# Patient Record
Sex: Male | Born: 1965 | Race: White | Hispanic: No | Marital: Married | Smoking: Never smoker
Health system: Southern US, Community
[De-identification: ages and names within clinical notes are randomized; demographics above are authoritative.]

## PROBLEM LIST (undated history)

## (undated) DIAGNOSIS — J189 Pneumonia, unspecified organism: Secondary | ICD-10-CM

## (undated) DIAGNOSIS — K219 Gastro-esophageal reflux disease without esophagitis: Secondary | ICD-10-CM

## (undated) DIAGNOSIS — G249 Dystonia, unspecified: Secondary | ICD-10-CM

## (undated) DIAGNOSIS — I10 Essential (primary) hypertension: Secondary | ICD-10-CM

## (undated) DIAGNOSIS — J90 Pleural effusion, not elsewhere classified: Secondary | ICD-10-CM

## (undated) DIAGNOSIS — Z9109 Other allergy status, other than to drugs and biological substances: Secondary | ICD-10-CM

## (undated) DIAGNOSIS — E119 Type 2 diabetes mellitus without complications: Secondary | ICD-10-CM

## (undated) DIAGNOSIS — I429 Cardiomyopathy, unspecified: Secondary | ICD-10-CM

## (undated) DIAGNOSIS — K589 Irritable bowel syndrome without diarrhea: Secondary | ICD-10-CM

## (undated) DIAGNOSIS — I428 Other cardiomyopathies: Secondary | ICD-10-CM

## (undated) HISTORY — DX: Cardiomyopathy, unspecified: I42.9

## (undated) HISTORY — DX: Other cardiomyopathies: I42.8

## (undated) HISTORY — DX: Essential (primary) hypertension: I10

## (undated) HISTORY — DX: Pleural effusion, not elsewhere classified: J90

---

## 1998-10-11 ENCOUNTER — Encounter: Payer: Self-pay | Admitting: Emergency Medicine

## 1998-10-11 ENCOUNTER — Emergency Department (HOSPITAL_COMMUNITY): Admission: EM | Admit: 1998-10-11 | Discharge: 1998-10-11 | Payer: Self-pay | Admitting: Emergency Medicine

## 2001-12-15 ENCOUNTER — Emergency Department (HOSPITAL_COMMUNITY): Admission: EM | Admit: 2001-12-15 | Discharge: 2001-12-16 | Payer: Self-pay | Admitting: Emergency Medicine

## 2001-12-16 ENCOUNTER — Encounter: Payer: Self-pay | Admitting: Emergency Medicine

## 2002-04-08 ENCOUNTER — Emergency Department (HOSPITAL_COMMUNITY): Admission: EM | Admit: 2002-04-08 | Discharge: 2002-04-08 | Payer: Self-pay | Admitting: Emergency Medicine

## 2002-04-08 ENCOUNTER — Encounter: Payer: Self-pay | Admitting: Emergency Medicine

## 2002-08-20 ENCOUNTER — Encounter: Admission: RE | Admit: 2002-08-20 | Discharge: 2002-08-20 | Payer: Self-pay | Admitting: Internal Medicine

## 2002-08-20 ENCOUNTER — Encounter: Payer: Self-pay | Admitting: Internal Medicine

## 2003-02-20 HISTORY — PX: LAPAROSCOPIC CHOLECYSTECTOMY: SUR755

## 2003-10-04 ENCOUNTER — Ambulatory Visit (HOSPITAL_COMMUNITY): Admission: RE | Admit: 2003-10-04 | Discharge: 2003-10-04 | Payer: Self-pay | Admitting: Internal Medicine

## 2003-10-22 ENCOUNTER — Ambulatory Visit (HOSPITAL_COMMUNITY): Admission: RE | Admit: 2003-10-22 | Discharge: 2003-10-22 | Payer: Self-pay | Admitting: Internal Medicine

## 2012-05-10 ENCOUNTER — Emergency Department (HOSPITAL_BASED_OUTPATIENT_CLINIC_OR_DEPARTMENT_OTHER)
Admission: EM | Admit: 2012-05-10 | Discharge: 2012-05-10 | Disposition: A | Discharge status: Home / Self Care | Payer: Self-pay | Attending: Emergency Medicine | Admitting: Emergency Medicine

## 2012-05-10 ENCOUNTER — Emergency Department (HOSPITAL_BASED_OUTPATIENT_CLINIC_OR_DEPARTMENT_OTHER): Payer: Self-pay

## 2012-05-10 ENCOUNTER — Encounter (HOSPITAL_BASED_OUTPATIENT_CLINIC_OR_DEPARTMENT_OTHER): Payer: Self-pay | Admitting: *Deleted

## 2012-05-10 DIAGNOSIS — Z79899 Other long term (current) drug therapy: Secondary | ICD-10-CM | POA: Insufficient documentation

## 2012-05-10 DIAGNOSIS — K219 Gastro-esophageal reflux disease without esophagitis: Secondary | ICD-10-CM | POA: Insufficient documentation

## 2012-05-10 DIAGNOSIS — Z9089 Acquired absence of other organs: Secondary | ICD-10-CM | POA: Insufficient documentation

## 2012-05-10 DIAGNOSIS — K589 Irritable bowel syndrome without diarrhea: Secondary | ICD-10-CM | POA: Insufficient documentation

## 2012-05-10 DIAGNOSIS — R51 Headache: Secondary | ICD-10-CM | POA: Insufficient documentation

## 2012-05-10 DIAGNOSIS — R209 Unspecified disturbances of skin sensation: Secondary | ICD-10-CM | POA: Insufficient documentation

## 2012-05-10 DIAGNOSIS — G500 Trigeminal neuralgia: Secondary | ICD-10-CM | POA: Insufficient documentation

## 2012-05-10 HISTORY — DX: Other allergy status, other than to drugs and biological substances: Z91.09

## 2012-05-10 HISTORY — DX: Irritable bowel syndrome, unspecified: K58.9

## 2012-05-10 LAB — COMPREHENSIVE METABOLIC PANEL
AST: 53 U/L — ABNORMAL HIGH (ref 0–37)
Albumin: 4.1 g/dL (ref 3.5–5.2)
BUN: 15 mg/dL (ref 6–23)
Calcium: 9.7 mg/dL (ref 8.4–10.5)
Creatinine, Ser: 1.1 mg/dL (ref 0.50–1.35)
Total Bilirubin: 0.8 mg/dL (ref 0.3–1.2)
Total Protein: 8.2 g/dL (ref 6.0–8.3)

## 2012-05-10 LAB — CBC WITH DIFFERENTIAL/PLATELET
Basophils Absolute: 0 10*3/uL (ref 0.0–0.1)
Basophils Relative: 0 % (ref 0–1)
Eosinophils Absolute: 0.1 10*3/uL (ref 0.0–0.7)
Eosinophils Relative: 1 % (ref 0–5)
HCT: 44.1 % (ref 39.0–52.0)
Hemoglobin: 15.3 g/dL (ref 13.0–17.0)
MCH: 29 pg (ref 26.0–34.0)
MCHC: 34.7 g/dL (ref 30.0–36.0)
MCV: 83.5 fL (ref 78.0–100.0)
Monocytes Absolute: 0.7 10*3/uL (ref 0.1–1.0)
Monocytes Relative: 11 % (ref 3–12)
Neutro Abs: 4 10*3/uL (ref 1.7–7.7)
RDW: 12.9 % (ref 11.5–15.5)

## 2012-05-10 MED ORDER — GABAPENTIN 300 MG PO CAPS: 300.0000 mg | ORAL_CAPSULE | Freq: Three times a day (TID) | ORAL | Status: DC

## 2012-05-10 MED ORDER — SODIUM CHLORIDE 0.9 % IV BOLUS (SEPSIS)
1000.0000 mL | Freq: Once | INTRAVENOUS | Status: AC
  Administered 2012-05-10: 1000 mL via INTRAVENOUS

## 2012-05-10 MED ORDER — ONDANSETRON HCL 4 MG/2ML IJ SOLN
4.0000 mg | Freq: Once | INTRAMUSCULAR | Status: AC
  Administered 2012-05-10: 4 mg via INTRAVENOUS
  Filled 2012-05-10: qty 2

## 2012-05-10 MED ORDER — KETOROLAC TROMETHAMINE 30 MG/ML IJ SOLN
30.0000 mg | Freq: Once | INTRAMUSCULAR | Status: AC
  Administered 2012-05-10: 30 mg via INTRAVENOUS
  Filled 2012-05-10: qty 1

## 2012-05-10 MED ORDER — MORPHINE SULFATE 4 MG/ML IJ SOLN
4.0000 mg | Freq: Once | INTRAMUSCULAR | Status: AC
  Administered 2012-05-10: 4 mg via INTRAVENOUS
  Filled 2012-05-10: qty 1

## 2012-05-10 MED ORDER — OXYCODONE-ACETAMINOPHEN 5-325 MG PO TABS: 2.0000 | ORAL_TABLET | ORAL | Status: DC | PRN

## 2012-05-10 MED ORDER — DIPHENHYDRAMINE HCL 50 MG/ML IJ SOLN
25.0000 mg | Freq: Once | INTRAMUSCULAR | Status: AC
  Administered 2012-05-10: 25 mg via INTRAVENOUS
  Filled 2012-05-10: qty 1

## 2012-05-10 NOTE — ED Notes (Signed)
Pt back from CT

## 2012-05-10 NOTE — ED Notes (Signed)
Pt c/o cold/flu-like s/s onset Wed. Decreased appetite. Throbbing H/A onset yesterday. PERL.

## 2012-05-10 NOTE — ED Provider Notes (Signed)
History    This chart was scribed for Richard Lyons, MD scribed by Magnus Sinning. The patient was seen in room MH06/MH06 at 20:17.   CSN: [id number]  Arrival date & time 05/10/12  1907  Chief Complaint  Patient presents with  . Headache    (Consider location/radiation/quality/duration/timing/severity/associated sxs/prior treatment) Patient is a 47 y.o. male presenting with headaches. The history is provided by the patient. No language interpreter was used.  Headache  Richard Vance is a 47 y.o. male who presents to the Emergency Department complaining of constant severe HA, onset last night. Pt also notes brief episode of  left hand numbness that occurred today when in bed.   The patient states he came down with cold and flu sxs, including cough, chills, loss of appetite, congestion 4 days ago and he says that he treated it with OTC medications. He says yesterday afternoon he began having severe HA that was relieved minimally by an Excedrin.  He denies any current visual changes or issues, but he states in the past he has experienced cloudiness and "globe like-light" around "christmas lights." He denies any fever, or neck pain, and additionally denies any DM, but states he does have elevated BP. He reports cholecystectomy 9 years ago and he says that at last visit for check-up with GI doctor, he was informed of high BP and they provided him with referral, which he says he has not yet contacted.   Past Medical History  Diagnosis Date  . IBS (irritable bowel syndrome)   . Environmental allergies   . Reflux     Past Surgical History  Procedure Laterality Date  . Cholecystectomy      History reviewed. No pertinent family history.  History  Substance Use Topics  . Smoking status: Never Smoker   . Smokeless tobacco: Not on file  . Alcohol Use: No      Review of Systems  Neurological: Positive for headaches.    Allergies  Review of patient's allergies indicates no known  allergies.  Home Medications   Current Outpatient Rx  Name  Route  Sig  Dispense  Refill  . dicyclomine (BENTYL) 10 MG capsule   Oral   Take 10 mg by mouth 4 (four) times daily -  before meals and at bedtime.         . diphenhydrAMINE (BENADRYL) 25 MG tablet   Oral   Take 25 mg by mouth every 6 (six) hours as needed for itching.         . famotidine (PEPCID) 20 MG tablet   Oral   Take 20 mg by mouth 2 (two) times daily.           BP 167/119  Pulse 116  Temp(Src) 98.3 F (36.8 C) (Oral)  Resp 20  Ht 5\' 7"  (1.702 m)  Wt 231 lb (104.781 kg)  BMI 36.17 kg/m2  SpO2 98%  Physical Exam  Nursing note and vitals reviewed. Constitutional: He is oriented to person, place, and time. He appears well-developed and well-nourished. No distress.  HENT:  Head: Normocephalic and atraumatic.  Eyes: Conjunctivae and EOM are normal. Pupils are equal, round, and reactive to light.  Fundoscopic exam was normal.   Neck: Normal range of motion. Neck supple. No tracheal deviation present.  Cardiovascular: Normal rate, regular rhythm and normal heart sounds.   No murmur heard. Pulmonary/Chest: Effort normal and breath sounds normal. No respiratory distress. He has no wheezes. He has no rales.  Abdominal: Soft. Bowel sounds  are normal. He exhibits no distension. There is no tenderness.  Musculoskeletal: Normal range of motion.  Neurological: He is alert and oriented to person, place, and time. No cranial nerve deficit or sensory deficit.  Skin: Skin is warm and dry. No rash noted.  Psychiatric: He has a normal mood and affect. His behavior is normal.    ED Course  Procedures (including critical care time) DIAGNOSTIC STUDIES: Oxygen Saturation is 98% on room air, normal by my interpretation.    COORDINATION OF CARE: 20:21: Physical exam performed. 20:23: Provided intent to order lab and administer medication for HA and nausea. Pt is agreeable.   Labs Reviewed  COMPREHENSIVE  METABOLIC PANEL - Abnormal; Notable for the following:    Glucose, Bld 146 (*)    AST 53 (*)    ALT 60 (*)    GFR calc non Af Amer 80 (*)    All other components within normal limits  CBC WITH DIFFERENTIAL   Ct Head Wo Contrast  05/10/2012  *RADIOLOGY REPORT*  Clinical Data:  Headache.  CT HEAD WITHOUT CONTRAST  Technique:  Contiguous axial images were obtained from the base of the skull through the vertex without contrast  Comparison:  None.  Findings:  The brain has a normal appearance without evidence for hemorrhage, acute infarction, hydrocephalus, or mass lesion.  There is no extra axial fluid collection.  The skull and paranasal sinuses are normal.  IMPRESSION: Normal CT of the head without contrast.   Original Report Authenticated By: Janeece Riggers, M.D.      No diagnosis found.    MDM  The patient presents here with complaints of severe, stabbing pains to the right side of his face and temple.  He later described these to me a "electrical shocks" that sounded like trigeminal neuralgia to me.  He was given medications and eventually his symptoms resolved.  I found nothing on the workup that appears emergent.  CT and labs were unremarkable and neurological exam was non-focal.  I will discharge him with pain meds and a course of neurontin.  He is to follow up with his pcp this week and return should his symptoms worsen or change.      I personally performed the services described in this documentation, which was scribed in my presence. The recorded information has been reviewed and is accurate.           Richard Lyons, MD 05/11/12 [phone]

## 2012-05-10 NOTE — ED Notes (Signed)
MD at bedside. 

## 2014-05-21 HISTORY — PX: VIDEO ASSISTED THORACOSCOPY (VATS)/DECORTICATION: SHX6171

## 2014-05-21 HISTORY — PX: LUNG DECORTICATION: SHX454

## 2014-08-31 NOTE — Progress Notes (Signed)
     HPI: 49 yo male for evaluation of cardiomyopathy. Patient was admitted in April 2016 to [facility] in [place] with exudative left pleural effusion secondary to pneumonia. He was diagnosed with a nonischemic cardiomyopathy with an ejection fraction of 15-20%. Full records are not available. He did have left video-assisted thoracic total decortication. Patient was treated with medications for his cardiomyopathy. Since returning home he has dyspnea with more moderate activities but not routine activities. No orthopnea or PND. No exertional chest pain or syncope. No pedal edema.  Current Outpatient Prescriptions  Medication Sig Dispense Refill  . chlorpheniramine (WAL-FINATE) 4 MG tablet Take 4 mg by mouth 2 (two) times daily as needed for allergies.    Marland Kitchen dicyclomine (BENTYL) 10 MG capsule Take 10 mg by mouth 2 (two) times daily.     . famotidine (PEPCID) 20 MG tablet Take 20 mg by mouth 2 (two) times daily.    . furosemide (LASIX) 40 MG tablet Take 40 mg by mouth.    Marland Kitchen glipiZIDE (GLUCOTROL) 10 MG tablet Take 10 mg by mouth daily before breakfast.    . metoprolol succinate (TOPROL-XL) 50 MG 24 hr tablet Take 50 mg by mouth daily. Take with or immediately following a meal.    . spironolactone (ALDACTONE) 25 MG tablet Take 25 mg by mouth daily.    . valsartan (DIOVAN) 160 MG tablet Take 1 tablet (160 mg total) by mouth daily. 30 tablet 9   No current facility-administered medications for this visit.    No Known Allergies   Past Medical History  Diagnosis Date  . IBS (irritable bowel syndrome)   . Environmental allergies   . Reflux   . Cardiomyopathy   . Recurrent left pleural effusion   . Hypertension   . Diabetes mellitus     Past Surgical History  Procedure Laterality Date  . Cholecystectomy    . Left pleural decortication      History   Social History  . Marital Status: Married    Spouse Name: N/A  . Number of Children: 3  . Years of Education: N/A    Occupational History  . Not on file.   Social History Main Topics  . Smoking status: Never Smoker   . Smokeless tobacco: Not on file  . Alcohol Use: No  . Drug Use: No  . Sexual Activity: Not on file   Other Topics Concern  . Not on file   Social History Narrative    Family History  Problem Relation Age of Onset  . Diabetes Father   . Heart disease Father     ROS: no fevers or chills, productive cough, hemoptysis, dysphasia, odynophagia, melena, hematochezia, dysuria, hematuria, rash, seizure activity, orthopnea, PND, pedal edema, claudication. Remaining systems are negative.  Physical Exam:   Blood pressure 163/110, pulse 125, height 5\' 7"  (1.702 m), weight 217 lb (98.431 kg).  General:  Well developed/well nourished in NAD Skin warm/dry Patient not depressed No peripheral clubbing Back-normal HEENT-normal/normal eyelids Neck supple/normal carotid upstroke bilaterally; no bruits; no JVD; no thyromegaly chest - CTA/ normal expansion CV - regular rhythm and tachycardic rate/normal S1 and S2; no murmurs, rubs;  PMI nondisplaced; positive gallop Abdomen -NT/ND, no HSM, no mass, + bowel sounds, no bruit 2+ femoral pulses, no bruits Ext-trace edema, no chords, 2+ DP Neuro-grossly nonfocal  ECG sinus tachycardia, left axis deviation, no significant ST changes.

## 2014-09-01 ENCOUNTER — Encounter: Payer: Self-pay | Admitting: Cardiology

## 2014-09-01 ENCOUNTER — Other Ambulatory Visit: Payer: Self-pay

## 2014-09-01 ENCOUNTER — Ambulatory Visit (INDEPENDENT_AMBULATORY_CARE_PROVIDER_SITE_OTHER): Payer: Self-pay | Admitting: Cardiology

## 2014-09-01 VITALS — BP 163/110 | HR 125 | Ht 67.0 in | Wt 217.0 lb

## 2014-09-01 DIAGNOSIS — I5041 Acute combined systolic (congestive) and diastolic (congestive) heart failure: Secondary | ICD-10-CM

## 2014-09-01 DIAGNOSIS — I10 Essential (primary) hypertension: Secondary | ICD-10-CM | POA: Insufficient documentation

## 2014-09-01 DIAGNOSIS — I420 Dilated cardiomyopathy: Secondary | ICD-10-CM | POA: Insufficient documentation

## 2014-09-01 DIAGNOSIS — I5022 Chronic systolic (congestive) heart failure: Secondary | ICD-10-CM | POA: Insufficient documentation

## 2014-09-01 MED ORDER — VALSARTAN 160 MG PO TABS: 160.0000 mg | ORAL_TABLET | Freq: Every day | ORAL | Status: DC

## 2014-09-01 NOTE — Patient Instructions (Signed)
Medication Instructions:  - increase diovan ( 160 mg ) daily sent into pt's requested pharmacy  Labwork: bmet  Testing/Procedures: none  Follow-Up: As scheduled  Any Other Special Instructions Will Be Listed Below (If Applicable).

## 2014-09-01 NOTE — Assessment & Plan Note (Signed)
Blood pressure is elevated. Increase Diovan to 160 mg daily. Continue to titrate medications for blood pressure control. I've asked him to follow his blood pressure at home and keep records. He will bring his cuff at next office visit and we will make sure it correlates with ours.

## 2014-09-01 NOTE — Assessment & Plan Note (Signed)
Patient appears to be euvolemic. Continue present dose of Dilantin ex. Check potassium and renal function.

## 2014-09-01 NOTE — Assessment & Plan Note (Signed)
Patient has a cardiomyopathy of uncertain etiology. His ejection fraction was 15% but I do not have all records available. We will obtain records from [facility] concerning for evaluation. His blood pressure is elevated and I wonder if he may have a hypertensive cardiomyopathy. No history of alcohol. We will review records and make sure that ischemia has been ruled out. It also occurred in the setting of pneumonia. Question septic cardiomyopathy. He does have a gallop on examination. Plan to continue Toprol and ARB. Increase Diovan to 160 mg daily. He will follow his blood pressure at home and see one of our assistance in approximately 4 weeks. Continue to titrate medications. Once fully titrated and blood pressure controlled we will plan to repeat echocardiogram. If ejection fraction less than 35% would need to consider ICD.

## 2014-09-02 ENCOUNTER — Telehealth: Payer: Self-pay | Admitting: *Deleted

## 2014-09-02 LAB — BASIC METABOLIC PANEL
BUN: 10 mg/dL (ref 6–23)
CALCIUM: 9 mg/dL (ref 8.4–10.5)
CO2: 25 mEq/L (ref 19–32)
CREATININE: 0.87 mg/dL (ref 0.50–1.35)
Chloride: 106 mEq/L (ref 96–112)
GLUCOSE: 89 mg/dL (ref 70–99)
Potassium: 3.5 mEq/L (ref 3.5–5.3)
SODIUM: 143 meq/L (ref 135–145)

## 2014-09-02 NOTE — Telephone Encounter (Signed)
LEFT MESSAGE  TO CALL BACK -- IN REGARDS TO LABS 

## 2014-09-02 NOTE — Telephone Encounter (Signed)
-----   Message from Lewayne Bunting, MD sent at 09/02/2014  6:05 AM EDT ----- Richard Vance

## 2014-09-03 ENCOUNTER — Telehealth: Payer: Self-pay | Admitting: *Deleted

## 2014-09-03 NOTE — Telephone Encounter (Signed)
Sent medical records request for [facility] in [place] back to pt.  I did not get pt to sign release.  Pt will either mail back to [place] or drop off at the [employer] in [facility]

## 2014-09-15 ENCOUNTER — Encounter: Payer: Self-pay | Admitting: *Deleted

## 2014-09-17 ENCOUNTER — Telehealth: Payer: Self-pay | Admitting: Cardiology

## 2014-09-17 NOTE — Telephone Encounter (Signed)
Patient reports he received a bill from [facility] for the lab work. States he was originally in [place] for lab work and then someone from [facility] took him to get the lab work. Explained that [facility] still processed the blood work even if a [facility] employee did the lab. He reports he was told [facility] would process the labs so he wouldn't have a [facility] lab fee. He reports he will call the med center on [id number] for more info regarding this  Patient notified labs were normal and that he will get a copy of the results in the mail

## 2014-09-17 NOTE — Telephone Encounter (Signed)
Pt would like his lab results from 09-01-14 please.

## 2014-09-21 ENCOUNTER — Telehealth: Payer: Self-pay | Admitting: Physician Assistant

## 2014-09-21 NOTE — Telephone Encounter (Signed)
ROI faxed to [facility] records received back placed in chart prep bin.

## 2014-09-22 ENCOUNTER — Telehealth: Payer: Self-pay | Admitting: Cardiology

## 2014-09-22 DIAGNOSIS — I5041 Acute combined systolic (congestive) and diastolic (congestive) heart failure: Secondary | ICD-10-CM

## 2014-09-22 MED ORDER — VALSARTAN 320 MG PO TABS: 320.0000 mg | ORAL_TABLET | Freq: Every day | ORAL | Status: DC

## 2014-09-22 NOTE — Telephone Encounter (Signed)
Patient call and left message that he has been on Valsartin  160 mg daily since July 13th and wants to know when is blood pressure will start getting lower  July 14th    165/134 July 16th    151/115 July 22nd    148/119 July 25th     168/131 August 3rd________  [facility]

## 2014-09-22 NOTE — Telephone Encounter (Signed)
Patient returned call.  He denies any headache, weakness, shortness of breath.  C/o feeling tired out  Blood pressure 8/2 150/129    8/3 160/127  Dr. Royann Shivers ordered patient to double up on Diovan;  320 mg daily  Patient understands to take two 160mg  daily of his Diovan  Patient will take blood pressure daily and call in 5 days with blood pressure readings  He will call sooner if he has any symptoms such as shortness of breath, Chest pain, Headache, loss of vision of weakness in right or left side

## 2014-09-22 NOTE — Telephone Encounter (Signed)
New message       Pt c/o medication issue:  1. Name of Medication: valsartin 2. How are you currently taking this medication (dosage and times per day)? 160mg  daily since July 13th 3. Are you having a reaction (difficulty breathing--STAT)? no  4. What is your medication issue?  Pt want to know how long it will take before he sees his bp getting lower?  bp readings are  July 14 165/134, July 16 151/115, July 22 148/119, July 25 168/131

## 2014-09-27 ENCOUNTER — Emergency Department (HOSPITAL_BASED_OUTPATIENT_CLINIC_OR_DEPARTMENT_OTHER)
Admission: EM | Admit: 2014-09-27 | Discharge: 2014-09-27 | Disposition: A | Discharge status: Home / Self Care | Payer: Self-pay | Attending: Emergency Medicine | Admitting: Emergency Medicine

## 2014-09-27 ENCOUNTER — Emergency Department (HOSPITAL_BASED_OUTPATIENT_CLINIC_OR_DEPARTMENT_OTHER): Payer: Self-pay

## 2014-09-27 ENCOUNTER — Encounter (HOSPITAL_BASED_OUTPATIENT_CLINIC_OR_DEPARTMENT_OTHER): Payer: Self-pay

## 2014-09-27 DIAGNOSIS — R5383 Other fatigue: Secondary | ICD-10-CM | POA: Insufficient documentation

## 2014-09-27 DIAGNOSIS — Z79899 Other long term (current) drug therapy: Secondary | ICD-10-CM | POA: Insufficient documentation

## 2014-09-27 DIAGNOSIS — I10 Essential (primary) hypertension: Secondary | ICD-10-CM | POA: Insufficient documentation

## 2014-09-27 DIAGNOSIS — E119 Type 2 diabetes mellitus without complications: Secondary | ICD-10-CM | POA: Insufficient documentation

## 2014-09-27 DIAGNOSIS — R110 Nausea: Secondary | ICD-10-CM | POA: Insufficient documentation

## 2014-09-27 DIAGNOSIS — R197 Diarrhea, unspecified: Secondary | ICD-10-CM | POA: Insufficient documentation

## 2014-09-27 DIAGNOSIS — R630 Anorexia: Secondary | ICD-10-CM | POA: Insufficient documentation

## 2014-09-27 DIAGNOSIS — K219 Gastro-esophageal reflux disease without esophagitis: Secondary | ICD-10-CM | POA: Insufficient documentation

## 2014-09-27 DIAGNOSIS — R188 Other ascites: Secondary | ICD-10-CM | POA: Insufficient documentation

## 2014-09-27 DIAGNOSIS — R17 Unspecified jaundice: Secondary | ICD-10-CM | POA: Insufficient documentation

## 2014-09-27 DIAGNOSIS — Z8709 Personal history of other diseases of the respiratory system: Secondary | ICD-10-CM | POA: Insufficient documentation

## 2014-09-27 LAB — COMPREHENSIVE METABOLIC PANEL
ALBUMIN: 4 g/dL (ref 3.5–5.0)
ALT: 16 U/L — AB (ref 17–63)
ANION GAP: 10 (ref 5–15)
AST: 22 U/L (ref 15–41)
Alkaline Phosphatase: 60 U/L (ref 38–126)
BILIRUBIN TOTAL: 3.1 mg/dL — AB (ref 0.3–1.2)
BUN: 16 mg/dL (ref 6–20)
CHLORIDE: 106 mmol/L (ref 101–111)
CO2: 24 mmol/L (ref 22–32)
Calcium: 9.4 mg/dL (ref 8.9–10.3)
Creatinine, Ser: 0.99 mg/dL (ref 0.61–1.24)
GFR calc Af Amer: >60 mL/min (ref >60)
Glucose, Bld: 129 mg/dL — ABNORMAL HIGH (ref 65–99)
Potassium: 3.9 mmol/L (ref 3.5–5.1)
Sodium: 140 mmol/L (ref 135–145)
Total Protein: 6.9 g/dL (ref 6.5–8.1)

## 2014-09-27 LAB — URINALYSIS, ROUTINE W REFLEX MICROSCOPIC
GLUCOSE, UA: NEGATIVE mg/dL
Hgb urine dipstick: NEGATIVE
KETONES UR: NEGATIVE mg/dL
Leukocytes, UA: NEGATIVE
NITRITE: NEGATIVE
Protein, ur: 100 mg/dL — AB
SPECIFIC GRAVITY, URINE: 1.024 (ref 1.005–1.030)
Urobilinogen, UA: 1 mg/dL (ref 0.0–1.0)
pH: 5 (ref 5.0–8.0)

## 2014-09-27 LAB — CBC WITH DIFFERENTIAL/PLATELET
BASOS PCT: 0 % (ref 0–1)
Basophils Absolute: 0 10*3/uL (ref 0.0–0.1)
Eosinophils Absolute: 0 10*3/uL (ref 0.0–0.7)
Eosinophils Relative: 1 % (ref 0–5)
HEMATOCRIT: 43 % (ref 39.0–52.0)
HEMOGLOBIN: 14.2 g/dL (ref 13.0–17.0)
LYMPHS PCT: 33 % (ref 12–46)
Lymphs Abs: 2.4 10*3/uL (ref 0.7–4.0)
MCH: 28.4 pg (ref 26.0–34.0)
MCHC: 33 g/dL (ref 30.0–36.0)
MCV: 86 fL (ref 78.0–100.0)
Monocytes Absolute: 0.5 10*3/uL (ref 0.1–1.0)
Monocytes Relative: 7 % (ref 3–12)
Neutro Abs: 4.3 10*3/uL (ref 1.7–7.7)
Neutrophils Relative %: 59 % (ref 43–77)
PLATELETS: 169 10*3/uL (ref 150–400)
RBC: 5 MIL/uL (ref 4.22–5.81)
RDW: 14.8 % (ref 11.5–15.5)
WBC: 7.2 10*3/uL (ref 4.0–10.5)

## 2014-09-27 LAB — LIPASE, BLOOD: LIPASE: 24 U/L (ref 22–51)

## 2014-09-27 LAB — URINE MICROSCOPIC-ADD ON

## 2014-09-27 LAB — TROPONIN I

## 2014-09-27 MED ORDER — METOCLOPRAMIDE HCL 10 MG PO TABS: 10.0000 mg | ORAL_TABLET | Freq: Four times a day (QID) | ORAL | Status: DC

## 2014-09-27 MED ORDER — IOHEXOL 300 MG/ML  SOLN
25.0000 mL | Freq: Once | INTRAMUSCULAR | Status: AC | PRN
  Administered 2014-09-27: 25 mL via ORAL

## 2014-09-27 MED ORDER — IOHEXOL 300 MG/ML  SOLN
100.0000 mL | Freq: Once | INTRAMUSCULAR | Status: AC | PRN
  Administered 2014-09-27: 100 mL via INTRAVENOUS

## 2014-09-27 NOTE — ED Provider Notes (Signed)
Care was discussed with me prior to patient's discharge. Patient was seen by Dr. Littie Deeds earlier along with the PA. Patient is here for abdominal bloating for the last 3 weeks as well as nausea. Patient's bilirubin was 3 but has normal AST and ALT though CT abdomen pelvis was ordered. CT showed new-onset ascites as well as small pericardial effusion. Patient was admitted several months ago and was diagnosed with nonischemic cardiomyopathy with EF 15%. Patient also had pleural effusion in time was secondary to pneumonia. Patient denies any chest pain or shortness of breath. He has been taking Lasix and is no additional leg swelling. Patient denies any abdominal pain and is not a drinker. He already had a cholecystectomy. Denies any fevers or chills. At this point, he has no signs of tamponade from the pericardial effusion. He does not appear to be in heart failure and appears euvolemic. He has no signs of liver failure. I do not know why he has new onset ascites. Has no signs of SBP. I discussed the case with Dr. Arlyce Dice from gastroenterology. He feels that patient is stable for discharge and his office will contact the patient this week. I told him to increase lasix to 40 mg BID x 4 days then back to usual dose. Will give reglan for symptomatic relief.   Richardean Canal, MD 09/27/14 [phone]

## 2014-09-27 NOTE — ED Notes (Addendum)
C/o nausea, abd bloating x 3 weeks-no relief with OTC meds

## 2014-09-27 NOTE — Discharge Instructions (Signed)
1. Medications: reglan, usual home medications 2. Treatment: rest, drink plenty of fluids 3. Follow Up: please followup with Dr. Arlyce Dice (gastroenterologist); r Ascites Ascites is a gathering of fluid in the belly (abdomen). This is most often caused by liver disease. It may also be caused by a number of other less common problems. It causes a ballooning out (distension) of the abdomen. CAUSES  Scarring of the liver (cirrhosis) is the most common cause of ascites. Other causes include:  Infection or inflammation in the abdomen.  Cancer in the abdomen.  Heart failure.  Certain forms of kidney failure (nephritic syndrome).  Inflammation of the pancreas.  Clots in the veins of the liver. SYMPTOMS  In the early stages of ascites, you may not have any symptoms. The main symptom of ascites is a sense of abdominal bloating. This is due to the presence of fluid. This may also cause an increase in abdominal or waist size. People with this condition can develop swelling in the legs, and men can develop a swollen scrotum. When there is a lot of fluid, it may be hard to breath. Stretching of the abdomen by fluid can be painful. DIAGNOSIS  Certain features of your medical history, such as a history of liver disease and of an enlarging abdomen, can suggest the presence of ascites. The diagnosis of ascites can be made on physical exam by your caregiver. An abdominal ultrasound examination can confirm that ascites is present, and estimate the amount of fluid. Once ascites is confirmed, it is important to determine its cause. Again, a history of one of the conditions listed in "CAUSES" provides a strong clue. A physical exam is important, and blood and X-ray tests may be needed. During a procedure called paracentesis, a sample of fluid is removed from the abdomen. This can determine certain key features about the fluid, such as whether or not infection or cancer is present. Your caregiver will determine if a  paracentesis is necessary. They will describe the procedure to you. PREVENTION  Ascites is a complication of other conditions. Therefore to prevent ascites, you must seek treatment for any significant health conditions you have. Once ascites is present, careful attention to fluid and salt intake may help prevent it from getting worse. If you have ascites, you should not drink alcohol. PROGNOSIS  The prognosis of ascites depends on the underlying disease. If the disease is reversible, such as with certain infections or with heart failure, then ascites may improve or disappear. When ascites is caused by cirrhosis, then it indicates that the liver disease has worsened, and further evaluation and treatment of the liver disease is needed. If your ascites is caused by cancer, then the success or failure of the cancer treatment will determine whether your ascites will improve or worsen. RISKS AND COMPLICATIONS  Ascites is likely to worsen if it is not properly diagnosed and treated. A large amount of ascites can cause pain and difficulty breathing. The main complication, besides worsening, is infection (called spontaneous bacterial peritonitis). This requires prompt treatment. TREATMENT  The treatment of ascites depends on its cause. When liver disease is your cause, medical management using water pills (diuretics) and decreasing salt intake is often effective. Ascites due to peritoneal inflammation or malignancy (cancer) alone does not respond to salt restriction and diuretics. Hospitalization is sometimes required. If the treatment of ascites cannot be managed with medications, a number of other treatments are available. Your caregivers will help you decide which will work best for you. Some  of these are:  Removal of fluid from the abdomen (paracentesis).  Fluid from the abdomen is passed into a vein (peritoneovenous shunting).  Liver transplantation.  Transjugular intrahepatic portosystemic stent  shunt. HOME CARE INSTRUCTIONS  It is important to monitor body weight and the intake and output of fluids. Weigh yourself at the same time every day. Record your weights. Fluid restriction may be necessary. It is also important to know your salt intake. The more salt you take in, the more fluid you will retain. Ninety percent of people with ascites respond to this approach.  Follow any directions for medicines carefully.  Follow up with your caregiver, as directed.  Report any changes in your health, especially any new or worsening symptoms.  If your ascites is from liver disease, avoid alcohol and other substances toxic to the liver. SEEK MEDICAL CARE IF:   Your weight increases more than a few pounds in a few days.  Your abdominal or waist size increases.  You develop swelling in your legs.  You had swelling and it worsens. SEEK IMMEDIATE MEDICAL CARE IF:   You develop a fever.  You develop new abdominal pain.  You develop difficulty breathing.  You develop confusion.  You have bleeding from the mouth, stomach, or rectum. MAKE SURE YOU:   Understand these instructions.  Will watch your condition.  Will get help right away if you are not doing well or get worse. Document Released: 02/05/2005 Document Revised: 04/30/2011 Document Reviewed: 09/06/2006 [facility] Patient Information 2015 [place], [place]. This information is not intended to replace advice given to you by your health care provider. Make sure you discuss any questions you have with your health care provider. eturn to the ED for severe persistent nausea, abdominal pain or swelling, fever, chills, chest pain, shortness of breath  Nausea, Adult Nausea means you feel sick to your stomach or need to throw up (vomit). It may be a sign of a more serious problem. If nausea gets worse, you may throw up. If you throw up a lot, you may lose too much body fluid (dehydration). HOME CARE   Get plenty of rest.  Ask your  doctor how to replace body fluid losses (rehydrate).  Eat small amounts of food. Sip liquids more often.  Take all medicines as told by your doctor. GET HELP RIGHT AWAY IF:  You have a fever.  You pass out (faint).  You keep throwing up or have blood in your throw up.  You are very weak, have dry lips or a dry mouth, or you are very thirsty (dehydrated).  You have dark or bloody poop (stool).  You have very bad chest or belly (abdominal) pain.  You do not get better after 2 days, or you get worse.  You have a headache. MAKE SURE YOU:  Understand these instructions.  Will watch your condition.  Will get help right away if you are not doing well or get worse. Document Released: 01/25/2011 Document Revised: 04/30/2011 Document Reviewed: 01/25/2011 [facility] Patient Information 2015 [place], [place]. This information is not intended to replace advice given to you by your health care provider. Make sure you discuss any questions you have with your health care provider.   Emergency Department Resource Guide 1) Find a Doctor and Pay Out of Pocket Although you won't have to find out who is covered by your insurance plan, it is a good idea to ask around and get recommendations. You will then need to call the office and see if  the doctor you have chosen will accept you as a new patient and what types of options they offer for patients who are self-pay. Some doctors offer discounts or will set up payment plans for their patients who do not have insurance, but you will need to ask so you aren't surprised when you get to your appointment.  2) Contact Your Local Health Department Not all health departments have doctors that can see patients for sick visits, but many do, so it is worth a call to see if yours does. If you don't know where your local health department is, you can check in your phone book. The CDC also has a tool to help you locate your state's health department, and many state  websites also have listings of all of their local health departments.  3) Find a Walk-in Clinic If your illness is not likely to be very severe or complicated, you may want to try a walk in clinic. These are popping up all over the country in pharmacies, drugstores, and shopping centers. They're usually staffed by nurse practitioners or physician assistants that have been trained to treat common illnesses and complaints. They're usually fairly quick and inexpensive. However, if you have serious medical issues or chronic medical problems, these are probably not your best option.  No Primary Care Doctor: - Call Health Connect at  [phone] - they can help you locate a primary care doctor that  accepts your insurance, provides certain services, etc. - Physician Referral Service- [phone]  Chronic Pain Problems: Organization         Address  Phone   Notes  [facility]  [phone] Patients need to be referred by their primary care doctor.   Medication Assistance: Organization         Address  Phone   Notes  [facility] Medication [facility] [address] [place] [place], [place] [address] [phone] --Must be a resident of [facility] -- Must have NO insurance coverage whatsoever (no Medicaid/ Medicare, etc.) -- The pt. MUST have a primary care doctor that directs their care regularly and follows them in the community   Med[facility]ist  [phone]   [employer]  [phone]    Agencies that provide inexpensive medical care: Organization         Address  Phone   Notes  [facility]  [phone]   [facility]    [phone]   [facility] [address] [place], [place] [address] [phone]   [facility] [address] [place] [place] [phone]   Planned Parenthood    [phone]   [facility]    [phone]   [facility]  [address], [place] Phone:  [phone], Fax:  [phone] Hours of Operation:  9 am - 6 pm, M-F.  Also accepts Medicaid/Medicare and self-pay.  [facility]  [address], [place] Phone: [phone], Fax: [phone]. Hours of Operation:  8:30 am - 5:30 pm, M-F.  Also accepts Medicaid and self-pay.  [facility] [place] [address], [place] Phone: [phone]   [facility] [address] Natasha Bence [place], [place] [phone], Ext. [phone] Mondays & Thursdays: 7-9 AM.  First 15 patients are seen on a first come, first serve basis.    Medicaid-accepting [facility] Providers:  Organization         Address  Phone   Notes  [facility] [address], [place] [phone] Also accepts self-pay patients.  [facility] [address] Laurell Josephs [place], [place]  [phone]   [facility] [address], [place] [phone]   [facility] [address], [place] [phone]   Renaye Rakers [address], [place]   [phone] Only accepts [place] Access [place] patients after they have their name applied to their card.   Self-Pay (no insurance) in [facility]:  Organization         Address  Phone   Notes  Sickle Cell Patients, [facility] [address] [place], [place] [phone]   [facility] [address] [place], [place] [phone]   [facility] [place]  [address], [place] [phone]   [facility]/Dr. Osei-Bonsu  [address] [place] or [address], [place] [phone] Phone number for both [place] and [place] locations is the same.  [facility] [address] [place] [phone]   [facility] [address] [place] or [address] [phone]   [facility] [address], [place] [phone], phone; [phone], fax Sees patients 1st and 3rd Saturday of every month.  Must not qualify for public or private insurance (i.e. Medicaid, Medicare, [place] Health Choice, Veterans' Benefits)  Household income should be no more than 200% of the poverty level The clinic cannot treat you if you are pregnant or think you are pregnant  Sexually transmitted diseases are not treated at the clinic.    Dental Care: Organization         Address  Phone  Notes  [facility] Department of [facility] [address] [place], [place] [phone] Accepts children up to age 60 who are enrolled in [place] or [place] Health Choice; pregnant women with a Medicaid card; and children who have applied for Medicaid or [place] Health Choice, but were declined, whose parents can pay a reduced fee at time of service.  [facility] Department of [facility]  [address], [place] [phone] Accepts children up to age 45 who are enrolled in [place] or [place] Health Choice; pregnant women with a Medicaid card; and children who have applied for Medicaid or [place] Health Choice, but were declined, whose parents can pay a reduced fee at time of service.  [facility]  [address] [place], [place] [phone] Patients are seen by appointment only. Walk-ins are not accepted. [facility] will see patients 5 years of age and older. Monday - Tuesday (8am-5pm) Most Wednesdays (8:30-5pm) $30 per visit, cash only  [facility]  [address], [facility] [phone] Patients are seen by appointment only. Walk-ins are not accepted. [facility] will see patients 38 years of age and older. One Wednesday Evening (Monthly: Volunteer Based).  $30 per visit, cash only  [employer]  [phone] for adults; Children under age 25, call Graduate Pediatric Dentistry at [phone]. Children aged 43-14, please call [phone] to request a pediatric application.  Dental services  are provided in all areas of dental care including fillings, crowns and bridges, complete and partial dentures, implants, gum treatment, root canals, and extractions. Preventive care is also provided. Treatment is provided to both adults and children. Patients are selected via a lottery and there is often a waiting list.   [facility] [address] [place]  [phone] [email or web address]   [facility] [address] [place], [place] [phone], Ext. [phone] Second and Fourth Thursday of each month, opens at 6:30 AM; Clinic ends at 9 AM.  Patients are seen on a first-come first-served basis, and a limited number are seen during each clinic.   [facility]  [address] Ether Griffins [place], [place] [phone]   Eligibility Requirements You must have lived in [place], [place], or [place] counties for at least the last three months.   You cannot be eligible for state or federal sponsored [place], including [employer], [place], or [employer].   You generally cannot be eligible for healthcare insurance through your employer.    How to apply: Eligibility screenings are held every Tuesday and Wednesday afternoon from 1:00 pm until 4:00 pm. You do not need an appointment for the interview!  [facility] [address] [place], [place] [phone]   [facility] Health Department  [phone]   [facility] Health Department  [phone]   [facility]  [phone]    Behavioral Health Resources in the Community: Intensive Outpatient Programs Organization         Address  Phone  Notes  [facility] [address], [place], [place]  [phone]   [facility]patient [address], [place], [place] [phone]   [facility] [address], [place], [place]  [phone]   [facility] Mental Health [address],  [place], [place] [phone] or [phone]   Substance Abuse Resources Organization         Address  Phone  Notes  [facility]  [phone]   [facility]  [phone]   [facility] [place]  [phone]   Floydene Flock  [phone]   Residential & Outpatient Substance Abuse Program  [phone]   Psychological Services Organization         Address  Phone  Notes  [facility] Behavioral Health  [phone]   [facility] Services  [phone]   [facility] [address], [place] [phone] or [phone]    Mobile Crisis Teams Organization         Address  Phone  Notes  [facility], Mobile Crisis Care Unit  [phone]   [facility]  [address] [place], [place] [phone]   Doristine Locks [address] [place] [phone]    Self-Help/Support Groups Organization         Address  Phone             Notes  [facility] [place] - variety of support groups  336- I7437963 Call for more information  Narcotics Anonymous (NA), [facility] [address], [employer] [place]  2 meetings at this location   Statistician         Address  Phone  Notes  [facility] [address],    [place]  [phone]   [facility]  [address], [place] [address], [place], [place] [phone]  [facility] [address] [place], [place] [phone] Admissions: 8am-3pm M-F  [facility] [address]    [place], [place] [phone]   [facility] [address] [place], [place], [place] [phone]   [facility] [address]   [place], [place] [phone]   [facility] - Intensive Outpatient [address] Laurell Josephs [address], [place], [place] [phone]   [facility] ([facility]iction [facility] [address] [place]  [place], [place] [phone] or [phone]   [facility] ([facility]) [address], [place], [place] [phone] Accepts Medicaid  [facility] [address],  [place] [phone] Substance Abuse/Addiction Treatment   [facility] Organization         Address  Phone  Notes  [facility]  [phone]   Angie Fava, PhD [address] Ervin Knack [place], [place]   [phone] or [phone]   [facility] Behavioral   [address] [place], [place] [phone]   [facility] [address], [place], [place] [phone] Insurance/Medicaid/sponsorship through [facility] and Families [address]                                    [place], [place] [phone] Therapy/tele-psych/case  [facility] [address], [place] [phone]    Dr. Lolly Mustache  [phone]   [facility]  [employer] [facility] Dept. 1) [address] [place] 2) [address], [place] 3)  [address], [place] [phone]  [phone]   [facility] Child Abuse Hotline 813 337 1551 or [phone] (After Hours)

## 2014-09-27 NOTE — ED Provider Notes (Signed)
CSN: [id number]     Arrival date & time 09/27/14  1319 History   First MD Initiated Contact with Patient 09/27/14 1341     Chief Complaint  Patient presents with  . Nausea    The history is provided by the patient. No language interpreter was used.    Richard Vance is a 49 year old male with a PMH of IBS, reflux, HTN, DM, cardiomyopathy, and recurrent left pleural effusion who presents with nausea x 3 weeks and abdominal bloating. He reports his nausea occurs intermittently every day and is not associated with eating or drinking. He reports he has tried tums, but has had little symptom relief. He denies fever, chills, unexpected weight changes, abdominal pain, vomiting, dysuria, urgency, frequency, chest pain, shortness of breath, headache, lightheadedness, dizziness. He reports he had "a few" episodes of diarrhea last week. He denies melena or hematochezia. He states he has eating less over the last three weeks due to his nausea, and reports he has felt stressed recently, as his daughter has been in the hospital for "a few weeks."  Past Medical History  Diagnosis Date  . IBS (irritable bowel syndrome)   . Environmental allergies   . Reflux   . Cardiomyopathy   . Recurrent left pleural effusion   . Hypertension   . Diabetes mellitus    Past Surgical History  Procedure Laterality Date  . Cholecystectomy    . Left pleural decortication     Family History  Problem Relation Age of Onset  . Diabetes Father   . Heart disease Father    History  Substance Use Topics  . Smoking status: Never Smoker   . Smokeless tobacco: Not on file  . Alcohol Use: No    Review of Systems  Constitutional: Positive for appetite change and fatigue. Negative for fever, chills, diaphoresis, activity change and unexpected weight change.       Reports he has not been as hungry over the past few weeks, and that he has felt more fatigued, which he attributes to adjusting his HTN meds.  Respiratory: Negative for  cough, shortness of breath and wheezing.   Cardiovascular: Negative for chest pain, palpitations and leg swelling.  Gastrointestinal: Positive for nausea and diarrhea. Negative for vomiting, abdominal pain, constipation, blood in stool and abdominal distention.       Reports nausea. Reports a few episodes of diarrhea last week.  Genitourinary: Negative for dysuria, urgency, frequency, flank pain and discharge.  Musculoskeletal: Negative for myalgias, back pain, arthralgias and neck pain.  Skin: Negative for color change, pallor and rash.  Neurological: Negative for dizziness, weakness, light-headedness, numbness and headaches.    Allergies  Review of patient's allergies indicates no known allergies.  Home Medications   Prior to Admission medications   Medication Sig Start Date End Date Taking? Authorizing Provider  chlorpheniramine (WAL-FINATE) 4 MG tablet Take 4 mg by mouth 2 (two) times daily as needed for allergies.    Historical Provider, MD  dicyclomine (BENTYL) 10 MG capsule Take 10 mg by mouth 2 (two) times daily.     Historical Provider, MD  famotidine (PEPCID) 20 MG tablet Take 20 mg by mouth 2 (two) times daily.    Historical Provider, MD  furosemide (LASIX) 40 MG tablet Take 40 mg by mouth.    Historical Provider, MD  glipiZIDE (GLUCOTROL) 10 MG tablet Take 10 mg by mouth daily before breakfast.    Historical Provider, MD  metoprolol succinate (TOPROL-XL) 50 MG 24 hr tablet  Take 50 mg by mouth daily. Take with or immediately following a meal.    Historical Provider, MD  spironolactone (ALDACTONE) 25 MG tablet Take 25 mg by mouth daily.    Historical Provider, MD  valsartan (DIOVAN) 320 MG tablet Take 1 tablet (320 mg total) by mouth daily. 09/22/14   Lewayne Bunting, MD    BP 160/110 mmHg  Pulse 105  Temp(Src) 98.1 F (36.7 C) (Oral)  Resp 18  Ht  (1.702 m)  Wt 216 lb (97.977 kg)  BMI 33.82 kg/m2  SpO2 100% Physical Exam  Constitutional: He is oriented to person,  place, and time. He appears well-developed and well-nourished. No distress.  HENT:  Head: Normocephalic and atraumatic.  Right Ear: External ear normal.  Left Ear: External ear normal.  Nose: Nose normal.  Mouth/Throat: Oropharynx is clear and moist.  Eyes: Conjunctivae and EOM are normal. Pupils are equal, round, and reactive to light. No scleral icterus.  Neck: Normal range of motion. No JVD present.  Cardiovascular: Normal rate, regular rhythm, normal heart sounds and intact distal pulses.   Pulmonary/Chest: Effort normal and breath sounds normal. No respiratory distress. He has no wheezes. He has no rales.  Abdominal: Soft. Normal appearance and bowel sounds are normal. He exhibits no distension and no mass. There is no tenderness. There is no rebound, no guarding and no CVA tenderness.  Musculoskeletal: Normal range of motion. He exhibits no edema or tenderness.  Neurological: He is alert and oriented to person, place, and time.  Skin: Skin is warm and dry. No rash noted. He is not diaphoretic. No erythema. No pallor.  Psychiatric: He has a normal mood and affect. His behavior is normal. Judgment and thought content normal.  Nursing note and vitals reviewed.   ED Course  Procedures (including critical care time)  Labs Review Labs Reviewed  COMPREHENSIVE METABOLIC PANEL - Abnormal; Notable for the following:    Glucose, Bld 129 (*)    ALT 16 (*)    Total Bilirubin 3.1 (*)    All other components within normal limits  URINALYSIS, ROUTINE W REFLEX MICROSCOPIC (NOT AT [facility]) - Abnormal; Notable for the following:    Color, Urine AMBER (*)    Bilirubin Urine SMALL (*)    Protein, ur 100 (*)    All other components within normal limits  CBC WITH DIFFERENTIAL/PLATELET  LIPASE, BLOOD  TROPONIN I  URINE MICROSCOPIC-ADD ON   Imaging Review Ct Abdomen Pelvis W Contrast  09/27/2014   CLINICAL DATA:  Abdominal pain for 3 weeks, nausea and bloating, diabetes, IBS, status  postcholecystectomy  EXAM: CT ABDOMEN AND PELVIS WITH CONTRAST  TECHNIQUE: Multidetector CT imaging of the abdomen and pelvis was performed using the standard protocol following bolus administration of intravenous contrast.  CONTRAST:  25mL OMNIPAQUE IOHEXOL 300 MG/ML SOLN, OMNIPAQUE IOHEXOL 300 MG/ML SOLN  COMPARISON:  None.  FINDINGS: Lung bases are unremarkable. Sagittal images of the spine shows mild degenerative changes lower thoracic spine. No aortic aneurysm. Fatty infiltration of the liver. Few scattered hepatic cysts are noted the largest in left hepatic dome measures 1.9 cm. No intrahepatic biliary ductal dilatation. Small pericardial effusion. There is small perihepatic and perisplenic ascites. Postcholecystectomy surgical clips are noted. The pancreas, spleen and adrenal glands are unremarkable. Kidneys are symmetrical in size and enhancement. No hydronephrosis or hydroureter. There is nonobstructive calculus in upper pole of the right kidney measures 4 mm.  Delayed renal images shows bilateral renal symmetrical excretion.  There  is no small bowel obstruction. Small ascites is noted in bilateral paracolic gutter. No pericecal inflammation. The terminal ileum is unremarkable. Moderate pelvic ascites. There is no free abdominal air. Prostate gland and seminal vesicles are unremarkable. Bilateral distal ureter is unremarkable. The urinary bladder is unremarkable.  The appendix is not identified. The SMA and celiac trunk are patent. Bilateral iliac arteries are patent.  IMPRESSION: 1. There is small pericardial effusion. Small perihepatic and perisplenic ascites. Small ascites noted bilateral paracolic gutters. Moderate pelvic ascites. 2. Fatty infiltration of the liver. Scattered hepatic cysts the largest measures 1.9 cm. 3. No hydronephrosis or hydroureter. 4. No small bowel obstruction 5. Status postcholecystectomy.   Electronically Signed   By: Natasha Mead M.D.   On: 09/27/2014 16:51     EKG  Interpretation   Date/Time:  Monday September 27 2014 14:26:24 EDT Ventricular Rate:  105 PR Interval:  164 QRS Duration: 96 QT Interval:  370 QTC Calculation: 489 R Axis:   -4 Text Interpretation:  Sinus tachycardia Possible Left atrial enlargement  Borderline ECG SINCE LAST TRACING HEART RATE HAS INCREASED Confirmed by  Mirian Mo [phone]) on 09/27/2014 2:55:16 PM      MDM   Final diagnoses:  Elevated bilirubin  Nausea    49 year old male with PMH of IBS, reflux, HTN, DM who presents with daily, intermittent nausea x 3 weeks and abdominal bloating. Reports diarrhea last week, now resolved. Recently hospitalized in April for left pleural effusion and cardiomyopathy with EF 15%.   Total bilirubin elevated at 3.1 in the ED. CT abdomen pelvis was obtained, which demonstrated new onset small perihepatic and perisplenic ascites, small ascites to bilateral paracolic gutters, moderate pelvis ascites. LFTs within normal limits. Patient denies excessive alcohol use. Doubt hepatic failure as source of ascites. Patient is afebrile, no fever or chills, no leukocytosis on CBC. No evidence for SBP. Denies chest pain, shortness of breath, dyspnea on exertion, lower extremity edema. Patient appears euvolemic. Doubt congestive heart failure exacerbation. Small pericardial effusion on CT abdomen pelvis. No JVD, no muffled heart sounds, no hypotension. No evidence for cardiac tamponade. EKG negative for acute ischemia. Troponin negative. Doubt ACS. Blood pressure in the ED 160/110. No signs of hypertensive urgency.   5:13 PM Patient discussed with Dr. Silverio Lay, who evaluated the patient. Dr. Arlyce Dice from GI consulted. Patient stable to be discharged home with GI follow up and reglan for nausea control. Return precautions discussed.   BP 160/110 mmHg  Pulse 105  Temp(Src) 98.1 F (36.7 C) (Oral)  Resp 18  Ht 5\' 7"  (1.702 m)  Wt 216 lb (97.977 kg)  BMI 33.82 kg/m2  SpO2 100%     Mady Gemma,  PA-C 09/27/14 1757  Mirian Mo, MD 09/29/14 2253

## 2014-09-28 ENCOUNTER — Telehealth: Payer: Self-pay

## 2014-09-28 NOTE — Telephone Encounter (Signed)
Left a message for the patient to call back. Scheduled an appointment for him if he is okay with it. 11/04/14 at 11:00. He does not have a PCP listed. He does have [place] Medicaid.

## 2014-09-28 NOTE — Telephone Encounter (Signed)
-----   Message from Louis Meckel, MD sent at 09/28/2014  9:34 AM EDT ----- Ascites ----- Message -----    From: Evalee Jefferson, LPN    Sent: 05/26/1853   8:55 AM      To: Louis Meckel, MD  Okay. What is the reason I should put? I didn't find any notes. ----- Message -----    From: Louis Meckel, MD    Sent: 09/28/2014   8:30 AM      To: Evalee Jefferson, LPN  [facility] contact patient to schedule an elective office visit

## 2014-10-01 NOTE — Telephone Encounter (Signed)
I have left message for the patient to call back 

## 2014-10-05 NOTE — Telephone Encounter (Signed)
I have left message for the patient to call back on his cell phone and his home phone.

## 2014-10-06 ENCOUNTER — Telehealth: Payer: Self-pay | Admitting: Cardiology

## 2014-10-06 ENCOUNTER — Ambulatory Visit: Payer: Self-pay | Admitting: Physician Assistant

## 2014-10-06 NOTE — Telephone Encounter (Signed)
Received records from [facility] for appointment with Dr Jens Som on 10/20/14 ([place]), .  Records given to [facility] (medical records) for Dr Ludwig Clarks schedule on 10/20/14 in [facility]. lp

## 2014-10-12 NOTE — Telephone Encounter (Signed)
Left another message on his cell phone with appointment information and requesting a call back to confirm.

## 2014-10-13 ENCOUNTER — Other Ambulatory Visit: Payer: Self-pay

## 2014-10-13 NOTE — Telephone Encounter (Signed)
Letter mailed to the patient

## 2014-10-20 ENCOUNTER — Encounter: Payer: Self-pay | Admitting: Cardiology

## 2014-10-20 ENCOUNTER — Ambulatory Visit (INDEPENDENT_AMBULATORY_CARE_PROVIDER_SITE_OTHER): Payer: Self-pay | Admitting: Cardiology

## 2014-10-20 ENCOUNTER — Encounter (HOSPITAL_COMMUNITY): Payer: Self-pay | Admitting: General Practice

## 2014-10-20 ENCOUNTER — Inpatient Hospital Stay (HOSPITAL_COMMUNITY)
Admission: AD | Admit: 2014-10-20 | Discharge: 2014-10-22 | DRG: 305 | Disposition: A | Discharge status: Home / Self Care | Payer: Self-pay | Source: Ambulatory Visit | Attending: Cardiology | Admitting: Cardiology

## 2014-10-20 ENCOUNTER — Telehealth: Payer: Self-pay | Admitting: Cardiology

## 2014-10-20 VITALS — BP 171/131 | HR 112 | Ht 67.0 in | Wt 210.0 lb

## 2014-10-20 DIAGNOSIS — I161 Hypertensive emergency: Secondary | ICD-10-CM | POA: Insufficient documentation

## 2014-10-20 DIAGNOSIS — I10 Essential (primary) hypertension: Secondary | ICD-10-CM

## 2014-10-20 DIAGNOSIS — I5022 Chronic systolic (congestive) heart failure: Secondary | ICD-10-CM | POA: Diagnosis present

## 2014-10-20 DIAGNOSIS — E876 Hypokalemia: Secondary | ICD-10-CM | POA: Diagnosis present

## 2014-10-20 DIAGNOSIS — I160 Hypertensive urgency: Secondary | ICD-10-CM

## 2014-10-20 DIAGNOSIS — I420 Dilated cardiomyopathy: Secondary | ICD-10-CM

## 2014-10-20 DIAGNOSIS — E119 Type 2 diabetes mellitus without complications: Secondary | ICD-10-CM | POA: Diagnosis present

## 2014-10-20 DIAGNOSIS — Z7982 Long term (current) use of aspirin: Secondary | ICD-10-CM

## 2014-10-20 DIAGNOSIS — Z794 Long term (current) use of insulin: Secondary | ICD-10-CM

## 2014-10-20 DIAGNOSIS — Z79899 Other long term (current) drug therapy: Secondary | ICD-10-CM

## 2014-10-20 DIAGNOSIS — T502X5A Adverse effect of carbonic-anhydrase inhibitors, benzothiadiazides and other diuretics, initial encounter: Secondary | ICD-10-CM | POA: Diagnosis present

## 2014-10-20 HISTORY — DX: Gastro-esophageal reflux disease without esophagitis: K21.9

## 2014-10-20 HISTORY — DX: Dystonia, unspecified: G24.9

## 2014-10-20 HISTORY — DX: Pneumonia, unspecified organism: J18.9

## 2014-10-20 HISTORY — DX: Type 2 diabetes mellitus without complications: E11.9

## 2014-10-20 LAB — COMPREHENSIVE METABOLIC PANEL
ALK PHOS: 78 U/L (ref 38–126)
ALT: 27 U/L (ref 17–63)
AST: 35 U/L (ref 15–41)
Albumin: 4.1 g/dL (ref 3.5–5.0)
Anion gap: 9 (ref 5–15)
BUN: 13 mg/dL (ref 6–20)
CALCIUM: 9.9 mg/dL (ref 8.9–10.3)
CHLORIDE: 104 mmol/L (ref 101–111)
CO2: 26 mmol/L (ref 22–32)
CREATININE: 0.94 mg/dL (ref 0.61–1.24)
GFR calc Af Amer: >60 mL/min (ref >60)
GFR calc non Af Amer: >60 mL/min (ref >60)
Glucose, Bld: 231 mg/dL — ABNORMAL HIGH (ref 65–99)
Potassium: 4 mmol/L (ref 3.5–5.1)
SODIUM: 139 mmol/L (ref 135–145)
Total Bilirubin: 1.5 mg/dL — ABNORMAL HIGH (ref 0.3–1.2)
Total Protein: 7.8 g/dL (ref 6.5–8.1)

## 2014-10-20 LAB — CBC WITH DIFFERENTIAL/PLATELET
Basophils Absolute: 0 10*3/uL (ref 0.0–0.1)
Basophils Relative: 0 % (ref 0–1)
Eosinophils Absolute: 0.1 10*3/uL (ref 0.0–0.7)
Eosinophils Relative: 2 % (ref 0–5)
HEMATOCRIT: 47.2 % (ref 39.0–52.0)
HEMOGLOBIN: 16 g/dL (ref 13.0–17.0)
LYMPHS ABS: 2.6 10*3/uL (ref 0.7–4.0)
LYMPHS PCT: 35 % (ref 12–46)
MCH: 28.1 pg (ref 26.0–34.0)
MCHC: 33.9 g/dL (ref 30.0–36.0)
MCV: 82.8 fL (ref 78.0–100.0)
MONO ABS: 0.5 10*3/uL (ref 0.1–1.0)
MONOS PCT: 7 % (ref 3–12)
NEUTROS ABS: 4.2 10*3/uL (ref 1.7–7.7)
NEUTROS PCT: 56 % (ref 43–77)
Platelets: 186 10*3/uL (ref 150–400)
RBC: 5.7 MIL/uL (ref 4.22–5.81)
RDW: 13.5 % (ref 11.5–15.5)
WBC: 7.4 10*3/uL (ref 4.0–10.5)

## 2014-10-20 LAB — TSH: TSH: 2.942 u[IU]/mL (ref 0.350–4.500)

## 2014-10-20 LAB — GLUCOSE, CAPILLARY: GLUCOSE-CAPILLARY: 199 mg/dL — AB (ref 65–99)

## 2014-10-20 LAB — MRSA PCR SCREENING: MRSA by PCR: NEGATIVE

## 2014-10-20 LAB — BRAIN NATRIURETIC PEPTIDE: B Natriuretic Peptide: 339.9 pg/mL — ABNORMAL HIGH (ref 0.0–100.0)

## 2014-10-20 LAB — MAGNESIUM: Magnesium: 2 mg/dL (ref 1.7–2.4)

## 2014-10-20 MED ORDER — NITROGLYCERIN IN D5W 200-5 MCG/ML-% IV SOLN
2.0000 ug/min | INTRAVENOUS | Status: DC
  Administered 2014-10-20: 10 ug/min via INTRAVENOUS
  Filled 2014-10-20: qty 250

## 2014-10-20 MED ORDER — IRBESARTAN 150 MG PO TABS
300.0000 mg | ORAL_TABLET | Freq: Every day | ORAL | Status: DC
  Administered 2014-10-21 – 2014-10-22 (×2): 300 mg via ORAL
  Filled 2014-10-20 (×2): qty 2

## 2014-10-20 MED ORDER — SODIUM CHLORIDE 0.9 % IJ SOLN
3.0000 mL | Freq: Two times a day (BID) | INTRAMUSCULAR | Status: DC
  Administered 2014-10-20 – 2014-10-22 (×4): 3 mL via INTRAVENOUS

## 2014-10-20 MED ORDER — HEPARIN SODIUM (PORCINE) 5000 UNIT/ML IJ SOLN
5000.0000 [IU] | Freq: Three times a day (TID) | INTRAMUSCULAR | Status: DC
  Administered 2014-10-20 – 2014-10-22 (×5): 5000 [IU] via SUBCUTANEOUS
  Filled 2014-10-20 (×5): qty 1

## 2014-10-20 MED ORDER — INSULIN ASPART 100 UNIT/ML ~~LOC~~ SOLN
0.0000 [IU] | Freq: Three times a day (TID) | SUBCUTANEOUS | Status: DC
  Administered 2014-10-21 (×2): 11 [IU] via SUBCUTANEOUS
  Administered 2014-10-21: 5 [IU] via SUBCUTANEOUS
  Administered 2014-10-22: 3 [IU] via SUBCUTANEOUS
  Administered 2014-10-22: 5 [IU] via SUBCUTANEOUS

## 2014-10-20 MED ORDER — FUROSEMIDE 40 MG PO TABS
40.0000 mg | ORAL_TABLET | Freq: Two times a day (BID) | ORAL | Status: DC
  Administered 2014-10-20 – 2014-10-22 (×4): 40 mg via ORAL
  Filled 2014-10-20 (×4): qty 1

## 2014-10-20 MED ORDER — METOPROLOL SUCCINATE ER 50 MG PO TB24
50.0000 mg | ORAL_TABLET | Freq: Every day | ORAL | Status: DC
  Administered 2014-10-21 – 2014-10-22 (×2): 50 mg via ORAL
  Filled 2014-10-20 (×2): qty 1

## 2014-10-20 MED ORDER — FUROSEMIDE 40 MG PO TABS: 40.0000 mg | ORAL_TABLET | Freq: Every day | ORAL | Status: DC

## 2014-10-20 MED ORDER — ACETAMINOPHEN 325 MG PO TABS
650.0000 mg | ORAL_TABLET | ORAL | Status: DC | PRN
  Administered 2014-10-21: 650 mg via ORAL
  Filled 2014-10-20: qty 2

## 2014-10-20 MED ORDER — NITROGLYCERIN 0.4 MG SL SUBL: 0.4000 mg | SUBLINGUAL_TABLET | SUBLINGUAL | Status: DC | PRN

## 2014-10-20 MED ORDER — SPIRONOLACTONE 25 MG PO TABS
25.0000 mg | ORAL_TABLET | Freq: Every day | ORAL | Status: DC
  Administered 2014-10-21 – 2014-10-22 (×2): 25 mg via ORAL
  Filled 2014-10-20 (×2): qty 1

## 2014-10-20 MED ORDER — DICYCLOMINE HCL 10 MG PO CAPS
10.0000 mg | ORAL_CAPSULE | Freq: Two times a day (BID) | ORAL | Status: DC
  Administered 2014-10-21 – 2014-10-22 (×3): 10 mg via ORAL
  Filled 2014-10-20 (×4): qty 1

## 2014-10-20 MED ORDER — METOCLOPRAMIDE HCL 10 MG PO TABS
10.0000 mg | ORAL_TABLET | Freq: Four times a day (QID) | ORAL | Status: DC
  Administered 2014-10-20 – 2014-10-22 (×5): 10 mg via ORAL
  Filled 2014-10-20 (×7): qty 1

## 2014-10-20 MED ORDER — ENSURE ENLIVE PO LIQD
237.0000 mL | Freq: Two times a day (BID) | ORAL | Status: DC
  Administered 2014-10-21 (×2): 237 mL via ORAL

## 2014-10-20 MED ORDER — SODIUM CHLORIDE 0.9 % IV SOLN: 250.0000 mL | INTRAVENOUS | Status: DC | PRN

## 2014-10-20 MED ORDER — ONDANSETRON HCL 4 MG/2ML IJ SOLN: 4.0000 mg | Freq: Four times a day (QID) | INTRAMUSCULAR | Status: DC | PRN

## 2014-10-20 MED ORDER — FAMOTIDINE 20 MG PO TABS
20.0000 mg | ORAL_TABLET | Freq: Two times a day (BID) | ORAL | Status: DC
  Administered 2014-10-20 – 2014-10-22 (×4): 20 mg via ORAL
  Filled 2014-10-20 (×4): qty 1

## 2014-10-20 MED ORDER — HYDRALAZINE HCL 25 MG PO TABS
25.0000 mg | ORAL_TABLET | Freq: Three times a day (TID) | ORAL | Status: DC
  Administered 2014-10-20 – 2014-10-21 (×2): 25 mg via ORAL
  Filled 2014-10-20 (×2): qty 1

## 2014-10-20 MED ORDER — ASPIRIN EC 81 MG PO TBEC
81.0000 mg | DELAYED_RELEASE_TABLET | Freq: Every day | ORAL | Status: DC
  Administered 2014-10-21 – 2014-10-22 (×2): 81 mg via ORAL
  Filled 2014-10-20 (×2): qty 1

## 2014-10-20 MED ORDER — GLIPIZIDE 5 MG PO TABS
10.0000 mg | ORAL_TABLET | Freq: Every day | ORAL | Status: DC
  Administered 2014-10-21: 10 mg via ORAL
  Filled 2014-10-20: qty 2

## 2014-10-20 MED ORDER — SODIUM CHLORIDE 0.9 % IJ SOLN: 3.0000 mL | INTRAMUSCULAR | Status: DC | PRN

## 2014-10-20 NOTE — Progress Notes (Signed)
Pt refused bed alarm tonight. Pt educated. Will cont to monitor pt.

## 2014-10-20 NOTE — Telephone Encounter (Signed)
Spoke with pt, Aware of dr crenshaw's recommendations.  °

## 2014-10-20 NOTE — Progress Notes (Signed)
HPI: FU cardiomyopathy. Patient was admitted in April 2016 to [facility] in [place] with exudative left pleural effusion secondary to pneumonia requiring VATS. Echocardiogram showed an ejection fraction of 15-20% and moderately reduced right ventricular function. Cardiac MRI showed severely reduced LV function with no evidence of myocardial infarction, myocarditis or infiltrative process. Right ventricular function moderate to severely reduced. Cardiomyopathy felt nonischemic. Patient was treated with medications for his cardiomyopathy. I initially saw the patient in July 2016. Cardiac medications increased. Patient seen in the emergency room in early August 2016 with abdominal bloating. CT of the abdomen revealed new onset ascites and small pericardial effusion. Lasix was increased. Bilirubin elevated at 3.1 but remaining liver functions normal. Since last seen, he has some dyspnea on exertion but no orthopnea, PND, pedal edema or chest pain or syncope. He has occasional abdominal fullness.  Current Outpatient Prescriptions  Medication Sig Dispense Refill  . chlorpheniramine (WAL-FINATE) 4 MG tablet Take 4 mg by mouth 2 (two) times daily as needed for allergies.    Marland Kitchen dicyclomine (BENTYL) 10 MG capsule Take 10 mg by mouth 2 (two) times daily.     . famotidine (PEPCID) 20 MG tablet Take 20 mg by mouth 2 (two) times daily.    . furosemide (LASIX) 40 MG tablet Take 40 mg by mouth.    Marland Kitchen glipiZIDE (GLUCOTROL) 10 MG tablet Take 10 mg by mouth daily before breakfast.    . metoCLOPramide (REGLAN) 10 MG tablet Take 1 tablet (10 mg total) by mouth every 6 (six) hours. 30 tablet 0  . metoprolol succinate (TOPROL-XL) 50 MG 24 hr tablet Take 50 mg by mouth daily. Take with or immediately following a meal.    . spironolactone (ALDACTONE) 25 MG tablet Take 25 mg by mouth daily.    . valsartan (DIOVAN) 320 MG tablet Take 1 tablet (320 mg total) by mouth daily. 90 tablet 3   No current  facility-administered medications for this visit.     Past Medical History  Diagnosis Date  . IBS (irritable bowel syndrome)   . Environmental allergies   . Reflux   . Cardiomyopathy   . Recurrent left pleural effusion   . Hypertension   . Diabetes mellitus     Past Surgical History  Procedure Laterality Date  . Cholecystectomy    . Left pleural decortication      Social History   Social History  . Marital Status: Married    Spouse Name: N/A  . Number of Children: 3  . Years of Education: N/A   Occupational History  . Not on file.   Social History Main Topics  . Smoking status: Never Smoker   . Smokeless tobacco: Not on file  . Alcohol Use: No  . Drug Use: No  . Sexual Activity: Not on file   Other Topics Concern  . Not on file   Social History Narrative    ROS: no headaches, no fevers or chills, productive cough, hemoptysis, dysphasia, odynophagia, melena, hematochezia, dysuria, hematuria, rash, seizure activity, orthopnea, PND, pedal edema, claudication. Remaining systems are negative.  Physical Exam: Well-developed well-nourished in no acute distress.  Skin is warm and dry.  Patient does not appear to be depressed. No peripheral clubbing. HEENT is normal With normal eyelids.  Neck is supple, normal carotid upstroke, no thyromegaly. Chest is clear to auscultation with normal expansion.  Cardiovascular exam is tachycardic, positive gallop, no murmur Abdominal exam nontender or distended. No masses palpated. No hepatosplenomegaly. 2+  femoral pulses. Extremities show no edema. 2+ dorsalis pedis pulses bilaterally. neuro grossly intact   09/27/2014-sinus tachycardia, rule out prior septal infarct, nonspecific ST changes.

## 2014-10-20 NOTE — Assessment & Plan Note (Signed)
Patient's blood pressure is severely elevated. He is mildly volume overloaded on examination. Plan to admit for blood pressure control and treatment of congestive heart failure. I recommended patient go straight to the hospital. However he stated he needed to go home and pay bills first. He understands the risk of uncontrolled hypertension. We will continue with his ARB. Continue low-dose dose beta blocker as he has only mild congestive heart failure. He will also require this for long-term blood pressure control and his cardiomyopathy. This can be advanced as tolerated but pulse and blood pressure as his heart failure improves. Add hydralazine 25 mg by mouth 3 times a day and IV nitroglycerin. Advance as needed for blood pressure control. Check renal Dopplers given severity of hypertension.

## 2014-10-20 NOTE — Assessment & Plan Note (Addendum)
Records from [facility] and [place] have been reviewed. Patient is felt to have a nonischemic cardiomyopathy. I believe this is likely related to severe uncontrolled hypertension. His blood pressure in the office today is 170/130 in both arms. He has a gallop on exam. Recent evaluation showed mild ascites and I feel this is likely related to his cardiomyopathy/CHF. We will continue with his ARB and low-dose beta blocker (change toprol to coreg 6.25 BID and increase as tolerated for BP). Given the severity of his hypertension I feel we should proceed with admission for additional medications to control blood pressure and to improve his congestive cardiomyopathy. I have discussed this with the patient and he agrees.

## 2014-10-20 NOTE — Telephone Encounter (Signed)
New message      Pt was seen today by Dr Jens Som.  He wanted him to go to the hosp because his bp was really high.  He forgot to tell Dr Jens Som that he has not taken his toprol for over 1wk because he ran out.  He can get it refilled today.  Knowing this, do you still want him to go to the hospital?

## 2014-10-20 NOTE — H&P (Signed)
Expand All Collapse All        HPI: FU cardiomyopathy. Patient was admitted in April 2016 to [facility] in [place] with exudative left pleural effusion secondary to pneumonia requiring VATS. Echocardiogram showed an ejection fraction of 15-20% and moderately reduced right ventricular function. Cardiac MRI showed severely reduced LV function with no evidence of myocardial infarction, myocarditis or infiltrative process. Right ventricular function moderate to severely reduced. Cardiomyopathy felt nonischemic. Patient was treated with medications for his cardiomyopathy. I initially saw the patient in July 2016. Cardiac medications increased. Patient seen in the emergency room in early August 2016 with abdominal bloating. CT of the abdomen revealed new onset ascites and small pericardial effusion. Lasix was increased. Bilirubin elevated at 3.1 but remaining liver functions normal. Since last seen, he has some dyspnea on exertion but no orthopnea, PND, pedal edema or chest pain or syncope. He has occasional abdominal fullness.  Current Outpatient Prescriptions  Medication Sig Dispense Refill  . chlorpheniramine (WAL-FINATE) 4 MG tablet Take 4 mg by mouth 2 (two) times daily as needed for allergies.    Marland Kitchen dicyclomine (BENTYL) 10 MG capsule Take 10 mg by mouth 2 (two) times daily.     . famotidine (PEPCID) 20 MG tablet Take 20 mg by mouth 2 (two) times daily.    . furosemide (LASIX) 40 MG tablet Take 40 mg by mouth.    Marland Kitchen glipiZIDE (GLUCOTROL) 10 MG tablet Take 10 mg by mouth daily before breakfast.    . metoCLOPramide (REGLAN) 10 MG tablet Take 1 tablet (10 mg total) by mouth every 6 (six) hours. 30 tablet 0  . metoprolol succinate (TOPROL-XL) 50 MG 24 hr tablet Take 50 mg by mouth daily. Take with or immediately following a meal.    . spironolactone (ALDACTONE) 25 MG tablet Take 25 mg by mouth daily.    . valsartan (DIOVAN) 320 MG tablet  Take 1 tablet (320 mg total) by mouth daily. 90 tablet 3   No current facility-administered medications for this visit.     Past Medical History  Diagnosis Date  . IBS (irritable bowel syndrome)   . Environmental allergies   . Reflux   . Cardiomyopathy   . Recurrent left pleural effusion   . Hypertension   . Diabetes mellitus     Past Surgical History  Procedure Laterality Date  . Cholecystectomy    . Left pleural decortication      Social History   Social History  . Marital Status: Married    Spouse Name: N/A  . Number of Children: 3  . Years of Education: N/A   Occupational History  . Not on file.   Social History Main Topics  . Smoking status: Never Smoker   . Smokeless tobacco: Not on file  . Alcohol Use: No  . Drug Use: No  . Sexual Activity: Not on file   Other Topics Concern  . Not on file   Social History Narrative    ROS: no headaches, no fevers or chills, productive cough, hemoptysis, dysphasia, odynophagia, melena, hematochezia, dysuria, hematuria, rash, seizure activity, orthopnea, PND, pedal edema, claudication. Remaining systems are negative.  Physical Exam: Well-developed well-nourished in no acute distress.  Skin is warm and dry.  Patient does not appear to be depressed. No peripheral clubbing. HEENT is normal With normal eyelids.  Neck is supple, normal carotid upstroke, no thyromegaly. Chest is clear to auscultation with normal expansion.  Cardiovascular exam is tachycardic, positive gallop, no murmur Abdominal exam nontender  or distended. No masses palpated. No hepatosplenomegaly. 2+ femoral pulses. Extremities show no edema. 2+ dorsalis pedis pulses bilaterally. neuro grossly intact   09/27/2014-sinus tachycardia, rule out prior septal infarct, nonspecific ST changes.               Congestive dilated cardiomyopathy - Lewayne Bunting, MD at 10/20/2014 12:48 PM     Status: Linus Orn Related Problem: Congestive dilated cardiomyopathy   Expand All Collapse All   Records from [facility] and [place] have been reviewed. Patient is felt to have a nonischemic cardiomyopathy. I believe this is likely related to severe uncontrolled hypertension. His blood pressure in the office today is 170/130 in both arms. He has a gallop on exam. Recent evaluation showed mild ascites and I feel this is likely related to his cardiomyopathy/CHF. We will continue with his ARB and low-dose beta blocker (change toprol to coreg 6.25 BID and increase as tolerated for BP). Given the severity of his hypertension I feel we should proceed with admission for additional medications to control blood pressure and to improve his congestive cardiomyopathy. I have discussed this with the patient and he agrees.         Revision History       Date/Time User Action    > 10/20/2014 1:20 PM Lewayne Bunting, MD Edit     10/20/2014 12:48 PM Lewayne Bunting, MD Create              Hypertensive emergency - Lewayne Bunting, MD at 10/20/2014 1:24 PM     Status: Written Related Problem: Hypertensive emergency   Expand All Collapse All   Patient's blood pressure is severely elevated. He is mildly volume overloaded on examination. Plan to admit for blood pressure control and treatment of congestive heart failure. I recommended patient go straight to the hospital. However he stated he needed to go home and pay bills first. He understands the risk of uncontrolled hypertension. We will continue with his ARB. Continue low-dose dose beta blocker as he has only mild congestive heart failure. He will also require this for long-term blood pressure control and his cardiomyopathy. This can be advanced as tolerated but pulse and blood pressure as his heart failure improves. Add hydralazine 25 mg by mouth 3 times a day and IV nitroglycerin. Advance as needed  for blood pressure control. Check renal Dopplers given severity of hypertension.            Chronic systolic congestive heart failure - Lewayne Bunting, MD at 10/20/2014 1:24 PM     Status: Written Related Problem: Chronic systolic congestive heart failure   Expand All Collapse All   Patient is mildly volume overloaded on examination. Increase Lasix to 40 mg twice a day and continue spironolactone. Follow renal function closely.

## 2014-10-20 NOTE — Assessment & Plan Note (Signed)
Patient is mildly volume overloaded on examination. Increase Lasix to 40 mg twice a day and continue spironolactone. Follow renal function closely.

## 2014-10-20 NOTE — Telephone Encounter (Signed)
[name]  

## 2014-10-21 ENCOUNTER — Encounter (HOSPITAL_COMMUNITY): Payer: Medicaid Other

## 2014-10-21 LAB — BASIC METABOLIC PANEL
Anion gap: 12 (ref 5–15)
BUN: 12 mg/dL (ref 6–20)
CALCIUM: 9.3 mg/dL (ref 8.9–10.3)
CO2: 22 mmol/L (ref 22–32)
CREATININE: 0.94 mg/dL (ref 0.61–1.24)
Chloride: 102 mmol/L (ref 101–111)
GFR calc Af Amer: >60 mL/min (ref >60)
GLUCOSE: 223 mg/dL — AB (ref 65–99)
POTASSIUM: 3.3 mmol/L — AB (ref 3.5–5.1)
Sodium: 136 mmol/L (ref 135–145)

## 2014-10-21 LAB — LIPID PANEL
CHOL/HDL RATIO: 7.8 ratio
Cholesterol: 196 mg/dL (ref 0–200)
HDL: 25 mg/dL — ABNORMAL LOW (ref >40)
LDL Cholesterol: 140 mg/dL — ABNORMAL HIGH (ref 0–99)
Triglycerides: 154 mg/dL — ABNORMAL HIGH (ref <150)
VLDL: 31 mg/dL (ref 0–40)

## 2014-10-21 LAB — GLUCOSE, CAPILLARY
GLUCOSE-CAPILLARY: 203 mg/dL — AB (ref 65–99)
GLUCOSE-CAPILLARY: 340 mg/dL — AB (ref 65–99)
GLUCOSE-CAPILLARY: 303 mg/dL — AB (ref 65–99)
GLUCOSE-CAPILLARY: 265 mg/dL — AB (ref 65–99)

## 2014-10-21 MED ORDER — ISOSORBIDE MONONITRATE ER 30 MG PO TB24
15.0000 mg | ORAL_TABLET | Freq: Every day | ORAL | Status: DC
  Administered 2014-10-21: 15 mg via ORAL
  Filled 2014-10-21 (×2): qty 1

## 2014-10-21 MED ORDER — HYDRALAZINE HCL 50 MG PO TABS
50.0000 mg | ORAL_TABLET | Freq: Three times a day (TID) | ORAL | Status: DC
  Administered 2014-10-21 – 2014-10-22 (×4): 50 mg via ORAL
  Filled 2014-10-21 (×4): qty 1

## 2014-10-21 MED ORDER — POTASSIUM CHLORIDE CRYS ER 20 MEQ PO TBCR
20.0000 meq | EXTENDED_RELEASE_TABLET | Freq: Two times a day (BID) | ORAL | Status: DC
  Administered 2014-10-21 – 2014-10-22 (×3): 20 meq via ORAL
  Filled 2014-10-21 (×3): qty 1

## 2014-10-21 NOTE — Progress Notes (Signed)
UR Completed Buffy Ehler Graves-Bigelow, RN,BSN [phone]  

## 2014-10-21 NOTE — Care Management Note (Addendum)
Case Management Note  Patient Details  Name: Richard Vance MRN: [id number] Date of Birth: 1965-07-13  Subjective/Objective:  Pt admitted from MD office for HTN Crisis. Pt had been without medications for a week. Pt has Medicaid. Cost of medications should be no more than $3.00 for each medication.                 Action/Plan: No needs identified by CM at this time.    Expected Discharge Date:                  Expected Discharge Plan:  Home/Self Care  In-House Referral:  NA  Discharge planning Services  CM Consult, Medication Assistance  Post Acute Care Choice:  NA Choice offered to:  NA  DME Arranged:  N/A DME Agency:  NA  HH Arranged:  NA HH Agency:  NA  Status of Service:  Completed, signed off  Medicare Important Message Given:    Date Medicare IM Given:    Medicare IM give by:    Date Additional Medicare IM Given:    Additional Medicare Important Message give by:     If discussed at Long Length of Stay Meetings, dates discussed:    Additional Comments: 1437 10-22-14 Tomi Bamberger, RN,BSN [phone] [facility] Bjorn Loser to help assist with Medicaid Retro for month of September. CM did speak with pt in regards to PCP needs and medication. Pt states he will f/u with cardiology and CM to provide pt with information to the [facility]. Per pt his wife works and she will be able to assist with medication cost. Pt uses [facility] and he said prices are affordable. No further needs from CM at this time.  Gala Lewandowsky, RN 10/21/2014, 10:42 AM

## 2014-10-21 NOTE — Progress Notes (Signed)
Patient Name: Richard Vance Date of Encounter: 10/21/2014     Active Problems:   Hypertensive urgency    SUBJECTIVE  The patient feels well this am. No chest pain. Mild headache. This may be from the IV NTG. No significant dyspnea. BP is down to 144/102 this am.  CURRENT MEDS . aspirin EC  81 mg Oral Daily  . dicyclomine  10 mg Oral BID  . famotidine  20 mg Oral BID  . feeding supplement (ENSURE ENLIVE)  237 mL Oral BID BM  . furosemide  40 mg Oral BID  . glipiZIDE  10 mg Oral QAC breakfast  . heparin  5,000 Units Subcutaneous 3 times per day  . hydrALAZINE  25 mg Oral 3 times per day  . insulin aspart  0-15 Units Subcutaneous TID WC  . irbesartan  300 mg Oral Daily  . metoCLOPramide  10 mg Oral Q6H  . metoprolol succinate  50 mg Oral Daily  . sodium chloride  3 mL Intravenous Q12H  . spironolactone  25 mg Oral Daily    OBJECTIVE  Filed Vitals:   10/20/14 1908 10/20/14 2046 10/21/14 0033 10/21/14 0508  BP: 166/125 174/124 137/91 144/102  Pulse: 120 58 102 55  Temp: 98.5 F (36.9 C) 98.3 F (36.8 C) 98.2 F (36.8 C) 97.8 F (36.6 C)  TempSrc: Oral Oral Oral Oral  SpO2: 99% 100% 97% 97%    Intake/Output Summary (Last 24 hours) at 10/21/14 0827 Last data filed at 10/21/14 0300  Gross per 24 hour  Intake      0 ml  Output   1600 ml  Net  -1600 ml   There were no vitals filed for this visit.  PHYSICAL EXAM  General: Pleasant, NAD. Neuro: Alert and oriented X 3. Moves all extremities spontaneously. Psych: Normal affect. HEENT:  Normal  Neck: Supple without bruits or JVD. Lungs:  Resp regular and unlabored, CTA. Heart: RRR .  Prominent S3 gallop Abdomen: Soft, non-tender, non-distended, BS + x 4.  Extremities: No clubbing, cyanosis or edema. DP/PT/Radials 2+ and equal bilaterally.  Accessory Clinical Findings  CBC  Recent Labs  10/20/14 1955  WBC 7.4  NEUTROABS 4.2  HGB 16.0  HCT 47.2  MCV 82.8  PLT 186   Basic Metabolic Panel  Recent Labs  10/20/14 1955 10/21/14 0545  NA 139 136  K 4.0 3.3*  CL 104 102  CO2 26 22  GLUCOSE 231* 223*  BUN 13 12  CREATININE 0.94 0.94  CALCIUM 9.9 9.3  MG 2.0  --    Liver Function Tests  Recent Labs  10/20/14 1955  AST 35  ALT 27  ALKPHOS 78  BILITOT 1.5*  PROT 7.8  ALBUMIN 4.1   No results for input(s): LIPASE, AMYLASE in the last 72 hours. Cardiac Enzymes No results for input(s): CKTOTAL, CKMB, CKMBINDEX, TROPONINI in the last 72 hours. BNP Invalid input(s): POCBNP D-Dimer No results for input(s): DDIMER in the last 72 hours. Hemoglobin A1C No results for input(s): HGBA1C in the last 72 hours. Fasting Lipid Panel  Recent Labs  10/21/14 0545  CHOL 196  HDL 25*  LDLCALC 140*  TRIG 154*  CHOLHDL 7.8   Thyroid Function Tests  Recent Labs  10/20/14 1955  TSH 2.942    TELE  Sinus tachycardia  ECG    Radiology/Studies  Ct Abdomen Pelvis W Contrast  09/27/2014   CLINICAL DATA:  Abdominal pain for 3 weeks, nausea and bloating, diabetes, IBS, status postcholecystectomy  EXAM:  CT ABDOMEN AND PELVIS WITH CONTRAST  TECHNIQUE: Multidetector CT imaging of the abdomen and pelvis was performed using the standard protocol following bolus administration of intravenous contrast.  CONTRAST:  83mL OMNIPAQUE IOHEXOL 300 MG/ML SOLN, OMNIPAQUE IOHEXOL 300 MG/ML SOLN  COMPARISON:  None.  FINDINGS: Lung bases are unremarkable. Sagittal images of the spine shows mild degenerative changes lower thoracic spine. No aortic aneurysm. Fatty infiltration of the liver. Few scattered hepatic cysts are noted the largest in left hepatic dome measures 1.9 cm. No intrahepatic biliary ductal dilatation. Small pericardial effusion. There is small perihepatic and perisplenic ascites. Postcholecystectomy surgical clips are noted. The pancreas, spleen and adrenal glands are unremarkable. Kidneys are symmetrical in size and enhancement. No hydronephrosis or hydroureter. There is nonobstructive  calculus in upper pole of the right kidney measures 4 mm.  Delayed renal images shows bilateral renal symmetrical excretion.  There is no small bowel obstruction. Small ascites is noted in bilateral paracolic gutter. No pericecal inflammation. The terminal ileum is unremarkable. Moderate pelvic ascites. There is no free abdominal air. Prostate gland and seminal vesicles are unremarkable. Bilateral distal ureter is unremarkable. The urinary bladder is unremarkable.  The appendix is not identified. The SMA and celiac trunk are patent. Bilateral iliac arteries are patent.  IMPRESSION: 1. There is small pericardial effusion. Small perihepatic and perisplenic ascites. Small ascites noted bilateral paracolic gutters. Moderate pelvic ascites. 2. Fatty infiltration of the liver. Scattered hepatic cysts the largest measures 1.9 cm. 3. No hydronephrosis or hydroureter. 4. No small bowel obstruction 5. Status postcholecystectomy.   Electronically Signed   By: Natasha Mead M.D.   On: 09/27/2014 16:51    ASSESSMENT AND PLAN 1. Uncontrolled hypertension, improving. Renal artery duplex pending. 2. Nonischemic cardiomyopathy 3. Diabetes mellitus 4. Hypokalemia secondary to diuretic.  Plan: DC IV NTG and begin imdur. Replete K. Increase hydralazine to 50 mg TID. Daily BMET.  Signed, Cassell Clement MD

## 2014-10-21 NOTE — Progress Notes (Signed)
Nutrition Brief Note  Patient identified on the Malnutrition Screening Tool (MST) Report  Wt Readings from Last 15 Encounters:  10/20/14 210 lb (95.255 kg)  09/27/14 216 lb (97.977 kg)  09/01/14 217 lb (98.431 kg)  05/10/12 231 lb (104.781 kg)   Patient reports that he has lost some weight recently, likely due to increase in Lasix dosage and fluid loss. < 3% weight loss over the past month is not significant.   BMI=32.9 Patient meets criteria for obesity, class 1 based on current BMI.   Current diet order is CHO modified, patient is consuming approximately 100% of meals at this time. Patient reports good appetite. Labs and medications reviewed.   No nutrition interventions warranted at this time. If nutrition issues arise, please consult RD.   Joaquin Courts, RD, LDN, CNSC Pager [phone] After Hours Pager [phone]

## 2014-10-22 ENCOUNTER — Inpatient Hospital Stay (HOSPITAL_COMMUNITY): Payer: Medicaid Other

## 2014-10-22 ENCOUNTER — Other Ambulatory Visit: Payer: Self-pay | Admitting: Cardiology

## 2014-10-22 ENCOUNTER — Telehealth: Payer: Self-pay | Admitting: Nurse Practitioner

## 2014-10-22 DIAGNOSIS — I10 Essential (primary) hypertension: Secondary | ICD-10-CM

## 2014-10-22 DIAGNOSIS — I5022 Chronic systolic (congestive) heart failure: Secondary | ICD-10-CM

## 2014-10-22 LAB — BASIC METABOLIC PANEL
Anion gap: 9 (ref 5–15)
BUN: 18 mg/dL (ref 6–20)
CHLORIDE: 103 mmol/L (ref 101–111)
CO2: 27 mmol/L (ref 22–32)
CREATININE: 1.11 mg/dL (ref 0.61–1.24)
Calcium: 9.6 mg/dL (ref 8.9–10.3)
GFR calc Af Amer: >60 mL/min (ref >60)
GFR calc non Af Amer: >60 mL/min (ref >60)
GLUCOSE: 196 mg/dL — AB (ref 65–99)
Potassium: 3.6 mmol/L (ref 3.5–5.1)
SODIUM: 139 mmol/L (ref 135–145)

## 2014-10-22 LAB — GLUCOSE, CAPILLARY
Glucose-Capillary: 205 mg/dL — ABNORMAL HIGH (ref 65–99)
Glucose-Capillary: 192 mg/dL — ABNORMAL HIGH (ref 65–99)

## 2014-10-22 MED ORDER — HYDRALAZINE HCL 50 MG PO TABS: 50.0000 mg | ORAL_TABLET | Freq: Three times a day (TID) | ORAL | Status: DC

## 2014-10-22 MED ORDER — ACETAMINOPHEN 325 MG PO TABS: 650.0000 mg | ORAL_TABLET | ORAL | Status: DC | PRN

## 2014-10-22 MED ORDER — GLIPIZIDE 10 MG PO TABS: 10.0000 mg | ORAL_TABLET | Freq: Every day | ORAL | Status: DC

## 2014-10-22 MED ORDER — POTASSIUM CHLORIDE CRYS ER 20 MEQ PO TBCR: 20.0000 meq | EXTENDED_RELEASE_TABLET | Freq: Two times a day (BID) | ORAL | Status: DC

## 2014-10-22 NOTE — Discharge Summary (Addendum)
Patient ID: Richard Vance,  MRN: [id number], DOB/AGE: April 01, 1965 49 y.o.  Admit date: 10/20/2014 Discharge date: 10/22/2014  Primary Care Provider: Rocky Morel, MD Primary Cardiologist: Dr Jens Som  Discharge Diagnoses Principal Problem:   Hypertensive emergency Active Problems:   Congestive dilated cardiomyopathy   Chronic systolic congestive heart failure    Procedures: Renal artery duplex 10/22/14   Hospital Course:  49 y/o male who was admitted in April 2016 to [facility] in [place] with exudative left pleural effusion secondary to pneumonia requiring VATS. Echocardiogram showed an ejection fraction of 15-20% and moderately reduced right ventricular function. Cardiac MRI showed severely reduced LV function with no evidence of myocardial infarction, myocarditis or infiltrative process. Right ventricular function moderate to severely reduced. Cardiomyopathy was felt nonischemic. He has seen Dr Jens Som as an OP. He presented to the office 10/20/14 and saw Dr Jens Som. At that time the pt's B/P was elevated and Dr Jens Som suggested admission. It turns ou the pt had run out his Toprolol about a week prior. The pt was admitted and his medications adjusted he diuresed > 3L and lost 8 lbs on PO Lasix BID. Dr Patty Sermons wanted to get renal artery dopplers before discharge and this was done- results are pending and will need f/u as an OP.   Discharge Vitals:  Blood pressure 122/92, pulse 98, temperature 98.1 F (36.7 C), temperature source Oral, resp. rate 18, height 5\' 7"  (1.702 m), weight 201 lb 15.1 oz (91.6 kg), SpO2 98 %.    Labs: Results for orders placed or performed during the hospital encounter of 10/20/14 (from the past 24 hour(s))  Glucose, capillary     Status: Abnormal   Collection Time: 10/21/14  4:25 PM  Result Value Ref Range   Glucose-Capillary 303 (H) 65 - 99 mg/dL  Glucose, capillary     Status: Abnormal   Collection Time: 10/21/14  8:56 PM  Result Value  Ref Range   Glucose-Capillary 265 (H) 65 - 99 mg/dL  Basic metabolic panel     Status: Abnormal   Collection Time: 10/22/14  5:08 AM  Result Value Ref Range   Sodium 139 135 - 145 mmol/L   Potassium 3.6 3.5 - 5.1 mmol/L   Chloride 103 101 - 111 mmol/L   CO2 27 22 - 32 mmol/L   Glucose, Bld 196 (H) 65 - 99 mg/dL   BUN 18 6 - 20 mg/dL   Creatinine, Ser 2.70 0.61 - 1.24 mg/dL   Calcium 9.6 8.9 - 62.3 mg/dL   GFR calc non Af Amer >60 >60 mL/min   GFR calc Af Amer >60 >60 mL/min   Anion gap 9 5 - 15  Glucose, capillary     Status: Abnormal   Collection Time: 10/22/14  7:37 AM  Result Value Ref Range   Glucose-Capillary 205 (H) 65 - 99 mg/dL  Glucose, capillary     Status: Abnormal   Collection Time: 10/22/14 11:46 AM  Result Value Ref Range   Glucose-Capillary 192 (H) 65 - 99 mg/dL    Disposition:  Follow-up Information    Follow up with Olga Millers, MD.   Specialty:  Cardiology   Why:  offcie will contact you   Contact information:   [address] [place] [address] [phone]       Discharge Medications:    Medication List    STOP taking these medications        WAL-FINATE 4 MG tablet  Generic drug:  chlorpheniramine  TAKE these medications        acetaminophen 325 MG tablet  Commonly known as:  TYLENOL  Take 2 tablets (650 mg total) by mouth every 4 (four) hours as needed for headache or mild pain.     dicyclomine 10 MG capsule  Commonly known as:  BENTYL  Take 10 mg by mouth 2 (two) times daily.     famotidine 20 MG tablet  Commonly known as:  PEPCID  Take 20 mg by mouth 2 (two) times daily.     furosemide 40 MG tablet  Commonly known as:  LASIX  Take 1 tablet (40 mg total) by mouth daily.     glipiZIDE 10 MG tablet  Commonly known as:  GLUCOTROL  Take 10 mg by mouth daily before breakfast.     hydrALAZINE 50 MG tablet  Commonly known as:  APRESOLINE  Take 1 tablet (50 mg total) by mouth every 8 (eight) hours.      metoCLOPramide 10 MG tablet  Commonly known as:  REGLAN  Take 1 tablet (10 mg total) by mouth every 6 (six) hours.     metoprolol succinate 50 MG 24 hr tablet  Commonly known as:  TOPROL-XL  Take 50 mg by mouth daily. Take with or immediately following a meal.     potassium chloride SA 20 MEQ tablet  Commonly known as:  K-DUR,KLOR-CON  Take 1 tablet (20 mEq total) by mouth 2 (two) times daily.     spironolactone 25 MG tablet  Commonly known as:  ALDACTONE  Take 12.5 mg by mouth daily.     valsartan 320 MG tablet  Commonly known as:  DIOVAN  Take 1 tablet (320 mg total) by mouth daily.         Duration of Discharge Encounter: Greater than 30 minutes including physician time.  Signed, Corine Shelter PA-C 10/22/2014 2:37 PM   Note-renal dopplers showed no evidence of renal artery stenosis.  Corine Shelter PA-C 10/23/2014 7:24 AM

## 2014-10-22 NOTE — Discharge Instructions (Signed)

## 2014-10-22 NOTE — Progress Notes (Addendum)
VASCULAR LAB PRELIMINARY  PRELIMINARY  PRELIMINARY  PRELIMINARY  Renal artery Duplex Completed. No evidence of renal artery stenosis noted in bilateral kidneys. Bilateral kidneys measured within normal limits.   Chauncy Lean, RVT 10/22/2014, 9:25 AM

## 2014-10-22 NOTE — Telephone Encounter (Signed)
New message      TCM appt on 11-01-14 with Lawson Fiscal per Franky Macho

## 2014-10-22 NOTE — Progress Notes (Signed)
Patient Name: Richard Vance Date of Encounter: 10/22/2014     Active Problems:   Hypertensive urgency    SUBJECTIVE  The patient feels well. His dyspnea is much improved. No chest pain. Tolerating hydralazine. Presently NPO awaiting renal artery duplex. Patient reports he had been out of his Toprol for about a week prior to admission.  CURRENT MEDS . aspirin EC  81 mg Oral Daily  . dicyclomine  10 mg Oral BID  . famotidine  20 mg Oral BID  . feeding supplement (ENSURE ENLIVE)  237 mL Oral BID BM  . furosemide  40 mg Oral BID  . glipiZIDE  10 mg Oral QAC breakfast  . heparin  5,000 Units Subcutaneous 3 times per day  . hydrALAZINE  50 mg Oral 3 times per day  . insulin aspart  0-15 Units Subcutaneous TID WC  . irbesartan  300 mg Oral Daily  . isosorbide mononitrate  15 mg Oral Daily  . metoCLOPramide  10 mg Oral Q6H  . metoprolol succinate  50 mg Oral Daily  . potassium chloride  20 mEq Oral BID  . sodium chloride  3 mL Intravenous Q12H  . spironolactone  25 mg Oral Daily    OBJECTIVE  Filed Vitals:   10/21/14 1646 10/21/14 2053 10/22/14 0051 10/22/14 0513  BP: 135/92 134/97 124/80 122/92  Pulse: 96 96 93 98  Temp: 98.3 F (36.8 C) 99.6 F (37.6 C) 97.8 F (36.6 C) 98.1 F (36.7 C)  TempSrc: Oral Oral Oral Oral  Resp:      Height:      Weight:      SpO2: 97% 97% 98% 98%    Intake/Output Summary (Last 24 hours) at 10/22/14 0820 Last data filed at 10/22/14 0500  Gross per 24 hour  Intake      3 ml  Output   1800 ml  Net  -1797 ml   Filed Weights   10/20/14 1908  Weight: 209 lb 10.5 oz (95.1 kg)    PHYSICAL EXAM  General: Pleasant, NAD. Neuro: Alert and oriented X 3. Moves all extremities spontaneously. Psych: Normal affect. HEENT:  Normal  Neck: Supple without bruits or JVD. Lungs:  Resp regular and unlabored, CTA. Heart: RRR soft S3 gallop. Abdomen: Soft, non-tender, non-distended, BS + x 4.  Extremities: No clubbing, cyanosis or edema.  DP/PT/Radials 2+ and equal bilaterally.  Accessory Clinical Findings  CBC  Recent Labs  10/20/14 1955  WBC 7.4  NEUTROABS 4.2  HGB 16.0  HCT 47.2  MCV 82.8  PLT 186   Basic Metabolic Panel  Recent Labs  10/20/14 1955 10/21/14 0545 10/22/14 0508  NA 139 136 139  K 4.0 3.3* 3.6  CL 104 102 103  CO2 GLUCOSE 231* 223* 196*  BUN CREATININE 0.94 0.94 1.11  CALCIUM 9.9 9.3 9.6  MG 2.0  --   --    Liver Function Tests  Recent Labs  10/20/14 1955  AST 35  ALT 27  ALKPHOS 78  BILITOT 1.5*  PROT 7.8  ALBUMIN 4.1   No results for input(s): LIPASE, AMYLASE in the last 72 hours. Cardiac Enzymes No results for input(s): CKTOTAL, CKMB, CKMBINDEX, TROPONINI in the last 72 hours. BNP Invalid input(s): POCBNP D-Dimer No results for input(s): DDIMER in the last 72 hours. Hemoglobin A1C No results for input(s): HGBA1C in the last 72 hours. Fasting Lipid Panel  Recent Labs  10/21/14 0545  CHOL 196  HDL  25*  LDLCALC 140*  TRIG 154*  CHOLHDL 7.8   Thyroid Function Tests  Recent Labs  10/20/14 1955  TSH 2.942    TELE  NSR.  ECG    Radiology/Studies  Ct Abdomen Pelvis W Contrast  09/27/2014   CLINICAL DATA:  Abdominal pain for 3 weeks, nausea and bloating, diabetes, IBS, status postcholecystectomy  EXAM: CT ABDOMEN AND PELVIS WITH CONTRAST  TECHNIQUE: Multidetector CT imaging of the abdomen and pelvis was performed using the standard protocol following bolus administration of intravenous contrast.  CONTRAST:  38mL OMNIPAQUE IOHEXOL 300 MG/ML SOLN, OMNIPAQUE IOHEXOL 300 MG/ML SOLN  COMPARISON:  None.  FINDINGS: Lung bases are unremarkable. Sagittal images of the spine shows mild degenerative changes lower thoracic spine. No aortic aneurysm. Fatty infiltration of the liver. Few scattered hepatic cysts are noted the largest in left hepatic dome measures 1.9 cm. No intrahepatic biliary ductal dilatation. Small pericardial effusion. There  is small perihepatic and perisplenic ascites. Postcholecystectomy surgical clips are noted. The pancreas, spleen and adrenal glands are unremarkable. Kidneys are symmetrical in size and enhancement. No hydronephrosis or hydroureter. There is nonobstructive calculus in upper pole of the right kidney measures 4 mm.  Delayed renal images shows bilateral renal symmetrical excretion.  There is no small bowel obstruction. Small ascites is noted in bilateral paracolic gutter. No pericecal inflammation. The terminal ileum is unremarkable. Moderate pelvic ascites. There is no free abdominal air. Prostate gland and seminal vesicles are unremarkable. Bilateral distal ureter is unremarkable. The urinary bladder is unremarkable.  The appendix is not identified. The SMA and celiac trunk are patent. Bilateral iliac arteries are patent.  IMPRESSION: 1. There is small pericardial effusion. Small perihepatic and perisplenic ascites. Small ascites noted bilateral paracolic gutters. Moderate pelvic ascites. 2. Fatty infiltration of the liver. Scattered hepatic cysts the largest measures 1.9 cm. 3. No hydronephrosis or hydroureter. 4. No small bowel obstruction 5. Status postcholecystectomy.   Electronically Signed   By: Natasha Mead M.D.   On: 09/27/2014 16:51    ASSESSMENT AND PLAN  1. Uncontrolled hypertension, improved with addition of hydralazine. Renal artery duplex pending. 2. Nonischemic cardiomyopathy 3. Diabetes mellitus 4. Hypokalemia secondary to diuretic, being repleted.  Plan: The patient is doing well on current regimen. Will stop the imdur I started yesterday.  Probably not needed. If his renal artery duplex is okay he can probably be discharged later today. Will need followup BMET early next week since on ARB, spironolactone, potassium and lasix.  Signed, Cassell Clement MD

## 2014-10-22 NOTE — Progress Notes (Signed)
Pt discharged to home, printed educational material on medications given and reviewed, discharge instructions reviewed, verbalized understanding, condition stable, denies pain or discomfort, left facility via wheel chair, accompanied by family members.  Raymon Mutton RN

## 2014-10-26 NOTE — Telephone Encounter (Signed)
° ° °  Routed to NL Triage °

## 2014-10-26 NOTE — Telephone Encounter (Signed)
LMTCB

## 2014-10-28 NOTE — Telephone Encounter (Signed)
Attempted to call mobile number - lady answered the phone and said she found this mobile phone in a bathroom at a school and will take it to the front office.  No answer on home #

## 2014-11-01 ENCOUNTER — Ambulatory Visit (INDEPENDENT_AMBULATORY_CARE_PROVIDER_SITE_OTHER): Payer: Self-pay | Admitting: Nurse Practitioner

## 2014-11-01 ENCOUNTER — Encounter: Payer: Self-pay | Admitting: Nurse Practitioner

## 2014-11-01 VITALS — BP 110/94 | HR 97 | Ht 67.0 in | Wt 207.4 lb

## 2014-11-01 DIAGNOSIS — I420 Dilated cardiomyopathy: Secondary | ICD-10-CM

## 2014-11-01 DIAGNOSIS — I10 Essential (primary) hypertension: Secondary | ICD-10-CM

## 2014-11-01 DIAGNOSIS — Z9114 Patient's other noncompliance with medication regimen: Secondary | ICD-10-CM

## 2014-11-01 DIAGNOSIS — I5022 Chronic systolic (congestive) heart failure: Secondary | ICD-10-CM

## 2014-11-01 LAB — BASIC METABOLIC PANEL
BUN: 19 mg/dL (ref 6–23)
CO2: 27 mEq/L (ref 19–32)
Calcium: 9.5 mg/dL (ref 8.4–10.5)
Chloride: 98 mEq/L (ref 96–112)
Creatinine, Ser: 1.05 mg/dL (ref 0.40–1.50)
GFR: 80.53 mL/min (ref >60.00)
Glucose, Bld: 321 mg/dL — ABNORMAL HIGH (ref 70–99)
Potassium: 3.7 mEq/L (ref 3.5–5.1)
Sodium: 134 mEq/L — ABNORMAL LOW (ref 135–145)

## 2014-11-01 LAB — BRAIN NATRIURETIC PEPTIDE: Pro B Natriuretic peptide (BNP): 51 pg/mL (ref 0.0–100.0)

## 2014-11-01 NOTE — Patient Instructions (Addendum)
We will be checking the following labs today - BMET and BNP   Medication Instructions:    Continue with your current medicines.   Take an extra dose of your lasix if you gain 3 or more pounds overnight    Testing/Procedures To Be Arranged:  N/A  Follow-Up:   See Dr. Jens Som in a month    Other Special Instructions:   Restrict your salt  Weigh every day  Low-Sodium Eating Plan Sodium raises blood pressure and causes water to be held in the body. Getting less sodium from food will help lower your blood pressure, reduce any swelling, and protect your heart, liver, and kidneys. We get sodium by adding salt (sodium chloride) to food. Most of our sodium comes from canned, boxed, and frozen foods. Restaurant foods, fast foods, and pizza are also very high in sodium. Even if you take medicine to lower your blood pressure or to reduce fluid in your body, getting less sodium from your food is important. WHAT IS MY PLAN? Most people should limit their sodium intake to 2,300 mg a day. Your health care provider recommends that you limit your sodium intake to __________ a day.  WHAT DO I NEED TO KNOW ABOUT THIS EATING PLAN? For the low-sodium eating plan, you will follow these general guidelines:  Choose foods with a % Daily Value for sodium of less than 5% (as listed on the food label).   Use salt-free seasonings or herbs instead of table salt or sea salt.   Check with your health care provider or pharmacist before using salt substitutes.   Eat fresh foods.  Eat more vegetables and fruits.  Limit canned vegetables. If you do use them, rinse them well to decrease the sodium.   Limit cheese to 1 oz (28 g) per day.   Eat lower-sodium products, often labeled as "lower sodium" or "no salt added."  Avoid foods that contain monosodium glutamate (MSG). MSG is sometimes added to [place] food and some canned foods.  Check food labels (Nutrition Facts labels) on foods to learn  how much sodium is in one serving.  Eat more home-cooked food and less restaurant, buffet, and fast food.  When eating at a restaurant, ask that your food be prepared with less salt or none, if possible.  HOW DO I READ FOOD LABELS FOR SODIUM INFORMATION? The Nutrition Facts label lists the amount of sodium in one serving of the food. If you eat more than one serving, you must multiply the listed amount of sodium by the number of servings. Food labels may also identify foods as:  Sodium free--Less than 5 mg in a serving.  Very low sodium--35 mg or less in a serving.  Low sodium--140 mg or less in a serving.  Light in sodium--50% less sodium in a serving. For example, if a food that usually has 300 mg of sodium is changed to become light in sodium, it will have 150 mg of sodium.  Reduced sodium--25% less sodium in a serving. For example, if a food that usually has 400 mg of sodium is changed to reduced sodium, it will have 300 mg of sodium. WHAT FOODS CAN I EAT? Grains Low-sodium cereals, including oats, puffed wheat and rice, and shredded wheat cereals. Low-sodium crackers. Unsalted rice and pasta. Lower-sodium bread.  Vegetables Frozen or fresh vegetables. Low-sodium or reduced-sodium canned vegetables. Low-sodium or reduced-sodium tomato sauce and paste. Low-sodium or reduced-sodium tomato and vegetable juices.  Fruits Fresh, frozen, and canned fruit. Fruit juice.  Meat and Other Protein Products Low-sodium canned tuna and salmon. Fresh or frozen meat, poultry, seafood, and fish. Lamb. Unsalted nuts. Dried beans, peas, and lentils without added salt. Unsalted canned beans. Homemade soups without salt. Eggs.  Dairy Milk. Soy milk. Ricotta cheese. Low-sodium or reduced-sodium cheeses. Yogurt.  Condiments Fresh and dried herbs and spices. Salt-free seasonings. Onion and garlic powders. Low-sodium varieties of mustard and ketchup. Lemon juice.  Fats and Oils Reduced-sodium  salad dressings. Unsalted butter.  Other Unsalted popcorn and pretzels.  The items listed above may not be a complete list of recommended foods or beverages. Contact your dietitian for more options. WHAT FOODS ARE NOT RECOMMENDED? Grains Instant hot cereals. Bread stuffing, pancake, and biscuit mixes. Croutons. Seasoned rice or pasta mixes. Noodle soup cups. Boxed or frozen macaroni and cheese. Self-rising flour. Regular salted crackers. Vegetables Regular canned vegetables. Regular canned tomato sauce and paste. Regular tomato and vegetable juices. Frozen vegetables in sauces. Salted french fries. Olives. Rosita Fire. Relishes. Sauerkraut. Salsa. Meat and Other Protein Products Salted, canned, smoked, spiced, or pickled meats, seafood, or fish. Bacon, ham, sausage, hot dogs, corned beef, chipped beef, and packaged luncheon meats. Salt pork. Jerky. Pickled herring. Anchovies, regular canned tuna, and sardines. Salted nuts. Dairy Processed cheese and cheese spreads. Cheese curds. Blue cheese and cottage cheese. Buttermilk.  Condiments Onion and garlic salt, seasoned salt, table salt, and sea salt. Canned and packaged gravies. Worcestershire sauce. Tartar sauce. Barbecue sauce. Teriyaki sauce. Soy sauce, including reduced sodium. Steak sauce. Fish sauce. Oyster sauce. Cocktail sauce. Horseradish. Regular ketchup and mustard. Meat flavorings and tenderizers. Bouillon cubes. Hot sauce. Tabasco sauce. Marinades. Taco seasonings. Relishes. Fats and Oils Regular salad dressings. Salted butter. Margarine. Ghee. Bacon fat.  Other Potato and tortilla chips. Corn chips and puffs. Salted popcorn and pretzels. Canned or dried soups. Pizza. Frozen entrees and pot pies.  The items listed above may not be a complete list of foods and beverages to avoid. Contact your dietitian for more information. Document Released: 07/28/2001 Document Revised: 02/10/2013 Document Reviewed: 12/10/2012 [facility] Patient  Information 2015 [place], [place]. This information is not intended to replace advice given to you by your health care provider. Make sure you discuss any questions you have with your health care provider.   Call the [facility] office at [phone] if you have any questions, problems or concerns.

## 2014-11-01 NOTE — Progress Notes (Signed)
CARDIOLOGY OFFICE NOTE  Date:  11/01/2014    Richard Vance Date of Birth: [birth year] Medical Record #[id number]  PCP:  Rocky Morel, MD  Cardiologist:  Jens Som    Chief Complaint  Patient presents with  . Cardiomyopathy    Post hospital visit - seen for Dr. Jens Som.     History of Present Illness: Richard Vance is a 49 y.o. male who presents today for a post hospital visit. This was to be a TOC - however phone call was not successful. Seen for Dr. Jens Som.   Patient was admitted in April 2016 to [facility] in [place] with exudative left pleural effusion secondary to pneumonia requiring VATS. Echocardiogram showed an ejection fraction of 15-20% and moderately reduced right ventricular function. Cardiac MRI showed severely reduced LV function with no evidence of myocardial infarction, myocarditis or infiltrative process. Right ventricular function moderate to severely reduced. 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 Patient was treated with medications for his cardiomyopathy. Initially seen by Dr. Jens Som back in July 2016. Cardiac medications increased. Patient seen in the emergency room in early August 2016 with abdominal bloating. CT of the abdomen revealed new onset ascites and small pericardial effusion. Lasix was increased. Bilirubin elevated at 3.1 but remaining liver functions normal.   Last seen at the end of August - had ran out of Toprol. BP very high and he had evidence of volume overload - he was readmitted to [facility].  His medications were adjusted - he diuresed > 3L and lost 8 lbs on PO Lasix BID. Renal artery dopplers were completed prior to discharge.  Comes back today. Here alone today. Says he is doing fine. BP has improved "greatly" at home. Typically now running 120's/100. Not really lightheaded or dizzy. Not short of breath but will get some DOE with walking - he will walk for 15 to 20 minutes at a time. Weight is up  here. He is not sure about his weights at home - he does not weigh every day. Not adding salt. He eats "a fair amount". He seems to be under the impression that his medicines "will take care of everything" and that he does not need to make lifestyle changes. He does not work - says its a long complicated story - has 2 kids that are autistic.    Past Medical History  Diagnosis Date  . IBS (irritable bowel syndrome)   . Environmental allergies   . Cardiomyopathy   . Recurrent left pleural effusion   . Hypertension   . GERD (gastroesophageal reflux disease)   . Pneumonia <2005 X 1; 05/2014  . Type II diabetes mellitus   . Dyskinesia dx'd 2000    Past Surgical History  Procedure Laterality Date  . Lung decortication Left 05/2014   . Laparoscopic cholecystectomy  2005  . Video assisted thoracoscopy (vats)/decortication  05/2014    "this time they used the camera"     Medications: Current Outpatient Prescriptions  Medication Sig Dispense Refill  . acetaminophen (TYLENOL) 325 MG tablet Take 2 tablets (650 mg total) by mouth every 4 (four) hours as needed for headache or mild pain.    Marland Kitchen dicyclomine (BENTYL) 10 MG capsule Take 10 mg by mouth 2 (two) times daily.     . famotidine (PEPCID) 20 MG tablet Take 20 mg by mouth 2 (two) times daily.    . furosemide (LASIX) 40 MG tablet Take 1 tablet (40 mg total) by mouth daily. 90 tablet  3  . glipiZIDE (GLUCOTROL) 10 MG tablet Take 1 tablet (10 mg total) by mouth daily before breakfast. 30 tablet 2  . hydrALAZINE (APRESOLINE) 50 MG tablet Take 1 tablet (50 mg total) by mouth every 8 (eight) hours. 100 tablet 11  . metoCLOPramide (REGLAN) 10 MG tablet Take 1 tablet (10 mg total) by mouth every 6 (six) hours. 30 tablet 0  . metoprolol succinate (TOPROL-XL) 50 MG 24 hr tablet Take 50 mg by mouth daily. Take with or immediately following a meal.    . potassium chloride SA (K-DUR,KLOR-CON) 20 MEQ tablet Take 1 tablet (20 mEq total) by mouth 2 (two) times  daily. 60 tablet 11  . spironolactone (ALDACTONE) 25 MG tablet Take 12.5 mg by mouth daily.     . valsartan (DIOVAN) 320 MG tablet Take 1 tablet (320 mg total) by mouth daily. 90 tablet 3   No current facility-administered medications for this visit.    Allergies: No Known Allergies  Social History: The patient  reports that he has never smoked. He has never used smokeless tobacco. He reports that he does not drink alcohol or use illicit drugs.   Family History: The patient's family history includes Diabetes in his father; Heart disease in his father.   Review of Systems: Please see the history of present illness.   Otherwise, the review of systems is positive for none.   All other systems are reviewed and negative.   Physical Exam: VS:  BP 110/94 mmHg  Pulse 97  Ht  (1.702 m)  Wt 207 lb 6.4 oz (94.076 kg)  BMI 32.48 kg/m2  SpO2 98% .  BMI Body mass index is 32.48 kg/(m^2).  Wt Readings from Last 3 Encounters:  11/01/14 207 lb 6.4 oz (94.076 kg)  10/22/14 201 lb 15.1 oz (91.6 kg)  10/20/14 210 lb (95.255 kg)   BP is 110/90 by me.   General: Pleasant. Well developed, well nourished and in no acute distress.  HEENT: Normal but with poor dentition. Neck: Supple, no JVD, carotid bruits, or masses noted.  Cardiac: Regular rate and rhythm. +S4.  No edema.  Respiratory:  Lungs are clear to auscultation bilaterally with normal work of breathing.  GI: Soft and nontender.  MS: No deformity or atrophy. Gait and ROM intact. Skin: Warm and dry. Color is normal.  Neuro:  Strength and sensation are intact and no gross focal deficits noted.  Psych: Alert, appropriate and with normal affect.   LABORATORY DATA:  EKG:  EKG is not ordered today.  Lab Results  Component Value Date   WBC 7.4 10/20/2014   HGB 16.0 10/20/2014   HCT 47.2 10/20/2014   PLT 186 10/20/2014   GLUCOSE 196* 10/22/2014   CHOL 196 10/21/2014   TRIG 154* 10/21/2014   HDL 25* 10/21/2014   LDLCALC 140*  10/21/2014   ALT 27 10/20/2014   AST 35 10/20/2014   NA 139 10/22/2014   K 3.6 10/22/2014   CL 103 10/22/2014   CREATININE 1.11 10/22/2014   BUN 18 10/22/2014   CO2 27 10/22/2014   TSH 2.942 10/20/2014    BNP (last 3 results)  Recent Labs  10/20/14 1955  BNP 339.9*    ProBNP (last 3 results) No results for input(s): PROBNP in the last 8760 hours.   Other Studies Reviewed Today: Summary:  - No evidence of renal artery stenosis noted bilaterally. - Bilateral normal intrarenal resistive indices.  Other specific details can be found in the table(s) above. Prepared  and Electronically Authenticated by  Cari Caraway MD 2016-09-03T07:08:52  Assessment/Plan: 1. NICM - with chronic systolic heart failure - seems to have poor understanding of the need for lifestyle changes (weights, salt restriction, etc). Counseled at length. Recheck his labs today. His weight is up. Will see what his BNP is. If chemistries look ok - will increase aldactone to full dose. See in 4 weeks. Consider echo on return.   2. DM - not checking his sugar  3. HTN - improved control - negative renal duplex. Will leave on current regimen. May try to increase Aldactone if lab ok.   4. Noncompliance   Current medicines are reviewed with the patient today.  The patient does not have concerns regarding medicines other than what has been noted above.  The following changes have been made:  See above.  Labs/ tests ordered today include:   No orders of the defined types were placed in this encounter.     Disposition:   FU with Dr. Jens Som in 4 weeks.  Patient is agreeable to this plan and will call if any problems develop in the interim.   Signed: Rosalio Macadamia, RN, ANP-C 11/01/2014 9:44 AM  [facility] [address] [place], [place]  [address] Phone: [phone] Fax: [phone]

## 2014-11-04 ENCOUNTER — Ambulatory Visit: Payer: Self-pay | Admitting: Gastroenterology

## 2014-11-29 NOTE — Progress Notes (Signed)
HPI: FU cardiomyopathy. Patient was admitted in April 2016 to [facility] in [place] with exudative left pleural effusion secondary to pneumonia requiring VATS. Echocardiogram showed an ejection fraction of 15-20% and moderately reduced right ventricular function. Cardiac MRI showed severely reduced LV function with no evidence of myocardial infarction, myocarditis or infiltrative process. Right ventricular function moderate to severely reduced. Cardiomyopathy felt nonischemic. Renal Doppler September 2016 showed no stenosis. Admitted in August 2016 with severe hypertension and CHF. Medications adjusted. Since last seen, He has improved. He has mild dyspnea with more extreme activities But not routine activities. No orthopnea or PND. No exertional chest pain or syncope. Pedal edema has improved.  Current Outpatient Prescriptions  Medication Sig Dispense Refill  . acetaminophen (TYLENOL) 325 MG tablet Take 2 tablets (650 mg total) by mouth every 4 (four) hours as needed for headache or mild pain.    Marland Kitchen dicyclomine (BENTYL) 10 MG capsule Take 10 mg by mouth 2 (two) times daily.     . famotidine (PEPCID) 20 MG tablet Take 20 mg by mouth 2 (two) times daily.    . furosemide (LASIX) 40 MG tablet Take 1 tablet (40 mg total) by mouth daily. 90 tablet 3  . glipiZIDE (GLUCOTROL) 10 MG tablet Take 1 tablet (10 mg total) by mouth daily before breakfast. 30 tablet 2  . hydrALAZINE (APRESOLINE) 50 MG tablet Take 1 tablet (50 mg total) by mouth every 8 (eight) hours. 100 tablet 11  . metoCLOPramide (REGLAN) 10 MG tablet Take 1 tablet (10 mg total) by mouth every 6 (six) hours. 30 tablet 0  . metoprolol succinate (TOPROL-XL) 50 MG 24 hr tablet Take 50 mg by mouth daily. Take with or immediately following a meal.    . potassium chloride SA (K-DUR,KLOR-CON) 20 MEQ tablet Take 1 tablet (20 mEq total) by mouth 2 (two) times daily. 60 tablet 11  . spironolactone (ALDACTONE) 25 MG tablet Take 12.5 mg by  mouth daily.     . valsartan (DIOVAN) 320 MG tablet Take 1 tablet (320 mg total) by mouth daily. 90 tablet 3   No current facility-administered medications for this visit.     Past Medical History  Diagnosis Date  . IBS (irritable bowel syndrome)   . Environmental allergies   . Cardiomyopathy (HCC)   . Recurrent left pleural effusion   . Hypertension   . GERD (gastroesophageal reflux disease)   . Pneumonia <2005 X 1; 05/2014  . Type II diabetes mellitus (HCC)   . Dyskinesia dx'd 2000    Past Surgical History  Procedure Laterality Date  . Lung decortication Left 05/2014   . Laparoscopic cholecystectomy  2005  . Video assisted thoracoscopy (vats)/decortication  05/2014    "this time they used the camera"    Social History   Social History  . Marital Status: Married    Spouse Name: N/A  . Number of Children: 3  . Years of Education: N/A   Occupational History  . Not on file.   Social History Main Topics  . Smoking status: Never Smoker   . Smokeless tobacco: Never Used  . Alcohol Use: No  . Drug Use: No  . Sexual Activity: Not Currently   Other Topics Concern  . Not on file   Social History Narrative    ROS: no fevers or chills, productive cough, hemoptysis, dysphasia, odynophagia, melena, hematochezia, dysuria, hematuria, rash, seizure activity, orthopnea, PND, claudication. Remaining systems are negative.  Physical Exam: Well-developed well-nourished in no  acute distress.  Skin is warm and dry.  HEENT is normal.  Neck is supple.  Chest is clear to auscultation with normal expansion.  Cardiovascular exam is regular rate and rhythm. Positive gallop Abdominal exam nontender or distended. No masses palpated. Extremities show Trace edema. neuro grossly intact

## 2014-12-01 ENCOUNTER — Other Ambulatory Visit: Payer: Self-pay | Admitting: Cardiology

## 2014-12-01 ENCOUNTER — Ambulatory Visit (INDEPENDENT_AMBULATORY_CARE_PROVIDER_SITE_OTHER): Payer: Self-pay | Admitting: Cardiology

## 2014-12-01 ENCOUNTER — Encounter: Payer: Self-pay | Admitting: Cardiology

## 2014-12-01 VITALS — BP 132/94 | HR 98 | Ht 67.0 in | Wt 212.0 lb

## 2014-12-01 DIAGNOSIS — I428 Other cardiomyopathies: Secondary | ICD-10-CM

## 2014-12-01 DIAGNOSIS — I429 Cardiomyopathy, unspecified: Secondary | ICD-10-CM

## 2014-12-01 LAB — BASIC METABOLIC PANEL
BUN: 16 mg/dL (ref 7–25)
CO2: 26 mmol/L (ref 20–31)
Calcium: 9.4 mg/dL (ref 8.6–10.3)
Chloride: 103 mmol/L (ref 98–110)
Creat: 1.1 mg/dL (ref 0.60–1.35)
Glucose, Bld: 174 mg/dL — ABNORMAL HIGH (ref 65–99)
Potassium: 4.3 mmol/L (ref 3.5–5.3)
Sodium: 139 mmol/L (ref 135–146)

## 2014-12-01 MED ORDER — HYDRALAZINE HCL 50 MG PO TABS: 75.0000 mg | ORAL_TABLET | Freq: Three times a day (TID) | ORAL | Status: DC

## 2014-12-01 MED ORDER — METOPROLOL SUCCINATE ER 50 MG PO TB24: ORAL_TABLET | ORAL | Status: DC

## 2014-12-01 MED ORDER — GLIPIZIDE 10 MG PO TABS: 10.0000 mg | ORAL_TABLET | Freq: Every day | ORAL | Status: DC

## 2014-12-01 MED ORDER — SPIRONOLACTONE 25 MG PO TABS: 12.5000 mg | ORAL_TABLET | Freq: Every day | ORAL | Status: DC

## 2014-12-01 NOTE — Assessment & Plan Note (Signed)
I think this is likely related to uncontrolled hypertension. Blood pressure better but remains elevated. Increase Toprol to 75 mg daily and hydralazine to 75 mg 3 times a day. Continue ARB. Once his blood pressure is controlled we will plan repeat echocardiogram to see if LV function has improved. If not he would need an ischemia evaluation at that point.

## 2014-12-01 NOTE — Assessment & Plan Note (Signed)
Volume status has improved. Continue present dose of diuretics. Check potassium and renal function.

## 2014-12-01 NOTE — Assessment & Plan Note (Signed)
Blood pressure elevated. Increase Toprol and hydralazine as described under cardiomyopathy.

## 2014-12-01 NOTE — Patient Instructions (Signed)
Medication Instructions:   Increase metoprolol to 75 mg once daily= 1 and 1/2 of the 50 mg tablets once daily  Increase hyrdalazine to 75 mg three times daily= 1 and 1/2 of the 50 mg tablet three times daily  Labwork:  Your physician recommends that you have lab work today   Follow-Up:  Your physician recommends that you schedule a follow-up appointment in: 3 months with dr Jens Som

## 2015-02-05 ENCOUNTER — Other Ambulatory Visit: Payer: Self-pay | Admitting: Cardiology

## 2015-02-07 ENCOUNTER — Telehealth: Payer: Self-pay | Admitting: Cardiology

## 2015-02-07 NOTE — Telephone Encounter (Signed)
Received a call from Lupita Leash with [facility] requesting refill for glipizide.Advised to call PCP for refill.

## 2015-02-07 NOTE — Telephone Encounter (Signed)
°*  STAT* If patient is at the pharmacy, call can be transferred to refill team.   1. Which medications need to be refilled? (please list name of each medication and dose if known) Glipizide   2. Which pharmacy/location (including street and city if local pharmacy) is medication to be sent to?[facility]   3. Do they need a 30 day or 90 day supply? 30

## 2015-02-08 ENCOUNTER — Other Ambulatory Visit: Payer: Self-pay

## 2015-03-02 ENCOUNTER — Ambulatory Visit (INDEPENDENT_AMBULATORY_CARE_PROVIDER_SITE_OTHER): Payer: Self-pay | Admitting: Cardiology

## 2015-03-02 ENCOUNTER — Encounter: Payer: Self-pay | Admitting: Cardiology

## 2015-03-02 VITALS — BP 137/94 | HR 92 | Ht 67.0 in | Wt 218.0 lb

## 2015-03-02 DIAGNOSIS — I429 Cardiomyopathy, unspecified: Secondary | ICD-10-CM

## 2015-03-02 DIAGNOSIS — I420 Dilated cardiomyopathy: Secondary | ICD-10-CM

## 2015-03-02 DIAGNOSIS — I10 Essential (primary) hypertension: Secondary | ICD-10-CM

## 2015-03-02 DIAGNOSIS — I5041 Acute combined systolic (congestive) and diastolic (congestive) heart failure: Secondary | ICD-10-CM

## 2015-03-02 DIAGNOSIS — I428 Other cardiomyopathies: Secondary | ICD-10-CM

## 2015-03-02 DIAGNOSIS — I5022 Chronic systolic (congestive) heart failure: Secondary | ICD-10-CM

## 2015-03-02 MED ORDER — METOPROLOL SUCCINATE ER 100 MG PO TB24: 100.0000 mg | ORAL_TABLET | Freq: Every day | ORAL | Status: DC

## 2015-03-02 MED ORDER — VALSARTAN 320 MG PO TABS: 320.0000 mg | ORAL_TABLET | Freq: Every day | ORAL | Status: DC

## 2015-03-02 MED ORDER — SPIRONOLACTONE 25 MG PO TABS
25.0000 mg | ORAL_TABLET | Freq: Every day | ORAL | Status: DC
Start: 2015-03-02 — End: 2015-09-15

## 2015-03-02 NOTE — Assessment & Plan Note (Signed)
Euvolemic on examination. Continue present dose of Lasix. Check potassium and renal function. 

## 2015-03-02 NOTE — Progress Notes (Signed)
HPI: FU cardiomyopathy. Patient was admitted in April 2016 to [facility] in [place] with exudative left pleural effusion secondary to pneumonia requiring VATS. Echocardiogram showed an ejection fraction of 15-20% and moderately reduced right ventricular function. Cardiac MRI showed severely reduced LV function with no evidence of myocardial infarction, myocarditis or infiltrative process. Right ventricular function moderate to severely reduced. Cardiomyopathy felt nonischemic. Renal Doppler September 2016 showed no stenosis. Admitted in August 2016 with severe hypertension and CHF. Medications adjusted. Since last seen, the patient has dyspnea with more extreme activities but not with routine activities. It is relieved with rest. It is not associated with chest pain. There is no orthopnea, PND or pedal edema. There is no syncope or palpitations. There is no exertional chest pain.   Current Outpatient Prescriptions  Medication Sig Dispense Refill  . acetaminophen (TYLENOL) 325 MG tablet Take 2 tablets (650 mg total) by mouth every 4 (four) hours as needed for headache or mild pain.    Marland Kitchen dicyclomine (BENTYL) 10 MG capsule Take 10 mg by mouth 2 (two) times daily.     . famotidine (PEPCID) 20 MG tablet Take 20 mg by mouth 2 (two) times daily.    . furosemide (LASIX) 40 MG tablet Take 1 tablet (40 mg total) by mouth daily. 90 tablet 3  . hydrALAZINE (APRESOLINE) 50 MG tablet Take 1.5 tablets (75 mg total) by mouth every 8 (eight) hours. 135 tablet 11  . metoCLOPramide (REGLAN) 10 MG tablet Take 1 tablet (10 mg total) by mouth every 6 (six) hours. 30 tablet 0  . metoprolol succinate (TOPROL-XL) 50 MG 24 hr tablet Take one and one half tablets once dailyTake with or immediately following a meal. 45 tablet 11  . potassium chloride SA (K-DUR,KLOR-CON) 20 MEQ tablet Take 1 tablet (20 mEq total) by mouth 2 (two) times daily. 60 tablet 11  . spironolactone (ALDACTONE) 25 MG tablet Take 0.5 tablets  (12.5 mg total) by mouth daily. (Patient taking differently: Take 12.5 mg by mouth as needed. ) 15 tablet 11  . valsartan (DIOVAN) 320 MG tablet Take 1 tablet (320 mg total) by mouth daily. 90 tablet 3  . glipiZIDE (GLUCOTROL) 10 MG tablet Take 1 tablet (10 mg total) by mouth daily before breakfast. (Patient not taking: Reported on 03/02/2015) 30 tablet 0   No current facility-administered medications for this visit.     Past Medical History  Diagnosis Date  . IBS (irritable bowel syndrome)   . Environmental allergies   . Cardiomyopathy (HCC)   . Recurrent left pleural effusion   . Hypertension   . GERD (gastroesophageal reflux disease)   . Pneumonia <2005 X 1; 05/2014  . Type II diabetes mellitus (HCC)   . Dyskinesia dx'd 2000    Past Surgical History  Procedure Laterality Date  . Lung decortication Left 05/2014   . Laparoscopic cholecystectomy  2005  . Video assisted thoracoscopy (vats)/decortication  05/2014    "this time they used the camera"    Social History   Social History  . Marital Status: Married    Spouse Name: N/A  . Number of Children: 3  . Years of Education: N/A   Occupational History  . Not on file.   Social History Main Topics  . Smoking status: Never Smoker   . Smokeless tobacco: Never Used  . Alcohol Use: No  . Drug Use: No  . Sexual Activity: Not Currently   Other Topics Concern  . Not on file  Social History Narrative    Family History  Problem Relation Age of Onset  . Diabetes Father   . Heart disease Father     ROS: no fevers or chills, productive cough, hemoptysis, dysphasia, odynophagia, melena, hematochezia, dysuria, hematuria, rash, seizure activity, orthopnea, PND, pedal edema, claudication. Remaining systems are negative.  Physical Exam: Well-developed well-nourished in no acute distress.  Skin is warm and dry.  HEENT is normal.  Neck is supple.  Chest is clear to auscultation with normal expansion.  Cardiovascular exam is  regular rate and rhythm.  Abdominal exam nontender or distended. No masses palpated. Extremities show no edema. neuro grossly intact  ECG Sinus rhythm, rate 92, lateral T-wave inversion.

## 2015-03-02 NOTE — Patient Instructions (Signed)
Medication Instructions:   INCREASE METOPROLOL TO 100 MG ONCE DAILY=2 OF THE 50 MG TABLETS ONCE DAILY  RESTART SPIRONOLACTONE 25 MG ONCE DAILY  Labwork:  Your physician recommends that you return for lab work in: ONE WEEK  Testing/Procedures:  Your physician has requested that you have an echocardiogram. Echocardiography is a painless test that uses sound waves to create images of your heart. It provides your doctor with information about the size and shape of your heart and how well your heart's chambers and valves are working. This procedure takes approximately one hour. There are no restrictions for this procedure.IN 4 WEEKS    Follow-Up:  Your physician wants you to follow-up in: 3 MONTHS WITH DR Jens Som You will receive a reminder letter in the mail two months in advance. If you don't receive a letter, please call our office to schedule the follow-up appointment.

## 2015-03-02 NOTE — Assessment & Plan Note (Signed)
I think this is likely related to uncontrolled hypertension. Blood pressure better but remains elevated. Increase Toprol to 100 mg daily; continue hydralazine 75 mg 3 times a day. Continue ARB. Add spironolactone 25 mg daily. Repeat echocardiogram in 4 weeks to see if LV function has improved with better BP control. If not he would need an ischemia evaluation at that point.

## 2015-03-02 NOTE — Assessment & Plan Note (Signed)
BP mildly elevated. Continue present meds but increase toprol to 100 mg daily and add spironolactone 25 mg daily, bmet one week.

## 2015-03-11 LAB — BASIC METABOLIC PANEL
BUN: 24 mg/dL (ref 7–25)
CALCIUM: 10.6 mg/dL — AB (ref 8.6–10.3)
CHLORIDE: 95 mmol/L — AB (ref 98–110)
CO2: 28 mmol/L (ref 20–31)
Creat: 1.35 mg/dL (ref 0.60–1.35)
Glucose, Bld: 351 mg/dL — ABNORMAL HIGH (ref 65–99)
POTASSIUM: 4.4 mmol/L (ref 3.5–5.3)
SODIUM: 134 mmol/L — AB (ref 135–146)

## 2015-04-06 ENCOUNTER — Ambulatory Visit (HOSPITAL_BASED_OUTPATIENT_CLINIC_OR_DEPARTMENT_OTHER)
Admission: RE | Admit: 2015-04-06 | Discharge: 2015-04-06 | Disposition: A | Discharge status: Home / Self Care | Payer: Self-pay | Source: Ambulatory Visit | Attending: Cardiology | Admitting: Cardiology

## 2015-04-06 DIAGNOSIS — I428 Other cardiomyopathies: Secondary | ICD-10-CM

## 2015-04-06 DIAGNOSIS — I429 Cardiomyopathy, unspecified: Secondary | ICD-10-CM | POA: Insufficient documentation

## 2015-04-06 NOTE — Progress Notes (Signed)
  Echocardiogram 2D Echocardiogram has been performed.  Richard Vance 04/06/2015, 11:37 AM

## 2015-08-26 NOTE — Progress Notes (Signed)
HPI: FU cardiomyopathy. Patient was admitted in April 2016 to [facility] in [place] with exudative left pleural effusion secondary to pneumonia requiring VATS. Echocardiogram showed an ejection fraction of 15-20% and moderately reduced right ventricular function. Cardiac MRI showed severely reduced LV function with no evidence of myocardial infarction, myocarditis or infiltrative process. Right ventricular function moderate to severely reduced. Cardiomyopathy felt nonischemic. Renal Doppler September 2016 showed no stenosis. Admitted in August 2016 with severe hypertension and CHF. Medications adjusted. Echocardiogram repeated February 2017. Ejection fraction 40-45% and grade 1 diastolic dysfunction. Since last seen, the patient has dyspnea with more extreme activities but not with routine activities. It is relieved with rest. It is not associated with chest pain. There is no orthopnea, PND or pedal edema. There is no syncope or palpitations. There is no exertional chest pain.    Current Outpatient Prescriptions  Medication Sig Dispense Refill  . acetaminophen (TYLENOL) 500 MG tablet Take 500 mg by mouth every 6 (six) hours as needed.    . dicyclomine (BENTYL) 10 MG capsule Take 10 mg by mouth 2 (two) times daily.     . famotidine (PEPCID) 20 MG tablet Take 20 mg by mouth 2 (two) times daily.    . furosemide (LASIX) 40 MG tablet Take 1 tablet (40 mg total) by mouth daily. 90 tablet 3  . glipiZIDE (GLUCOTROL) 10 MG tablet Take 1 tablet (10 mg total) by mouth daily before breakfast. 30 tablet 0  . hydrALAZINE (APRESOLINE) 50 MG tablet Take 1.5 tablets (75 mg total) by mouth every 8 (eight) hours. 135 tablet 11  . metoprolol succinate (TOPROL-XL) 100 MG 24 hr tablet Take 1 tablet (100 mg total) by mouth daily. (Patient taking differently: Take 100 mg by mouth 2 (two) times daily. ) 30 tablet 12  . potassium chloride SA (K-DUR,KLOR-CON) 20 MEQ tablet Take 1 tablet (20 mEq total) by mouth 2  (two) times daily. 60 tablet 11  . spironolactone (ALDACTONE) 25 MG tablet Take 1 tablet (25 mg total) by mouth daily. 30 tablet 11  . valsartan (DIOVAN) 320 MG tablet Take 1 tablet (320 mg total) by mouth daily. 30 tablet 11   No current facility-administered medications for this visit.     Past Medical History  Diagnosis Date  . IBS (irritable bowel syndrome)   . Environmental allergies   . Cardiomyopathy (HCC)   . Recurrent left pleural effusion   . Hypertension   . GERD (gastroesophageal reflux disease)   . Pneumonia <2005 X 1; 05/2014  . Type II diabetes mellitus (HCC)   . Dyskinesia dx'd 2000    Past Surgical History  Procedure Laterality Date  . Lung decortication Left 05/2014   . Laparoscopic cholecystectomy  2005  . Video assisted thoracoscopy (vats)/decortication  05/2014    "this time they used the camera"    Social History   Social History  . Marital Status: Married    Spouse Name: N/A  . Number of Children: 3  . Years of Education: N/A   Occupational History  . Not on file.   Social History Main Topics  . Smoking status: Never Smoker   . Smokeless tobacco: Never Used  . Alcohol Use: No  . Drug Use: No  . Sexual Activity: Not Currently   Other Topics Concern  . Not on file   Social History Narrative    Family History  Problem Relation Age of Onset  . Diabetes Father   . Heart disease Father  ROS: no fevers or chills, productive cough, hemoptysis, dysphasia, odynophagia, melena, hematochezia, dysuria, hematuria, rash, seizure activity, orthopnea, PND, pedal edema, claudication. Remaining systems are negative.  Physical Exam: Well-developed well-nourished in no acute distress.  Skin is warm and dry.  HEENT is normal.  Neck is supple.  Chest is clear to auscultation with normal expansion.  Cardiovascular exam is regular rate and rhythm.  Abdominal exam nontender or distended. No masses palpated. Extremities show no edema. neuro grossly  intact  ECG Sinus tachycardia at a rate of 111. Left axis deviation.  Assessment and plan  1 hypertension-blood pressure is much improved. Continue present medications.  2 Chronic systolic congestive heart failure-continue present dose of diuretics. Check potassium and renal function.  3 cardiomyopathy-presumed related to hypertension and nonischemic. His blood pressure is now controlled and last echocardiogram showed that his LV function was improving. We will repeat echocardiogram. Continue Toprol, ARB and hydralazine. Continue spironolactone. If LV function has not normalized on follow-up echo we will plan nuclear study to screen for ischemia.  Olga Millers, MD

## 2015-08-31 ENCOUNTER — Ambulatory Visit (INDEPENDENT_AMBULATORY_CARE_PROVIDER_SITE_OTHER): Payer: Medicaid Other | Admitting: Cardiology

## 2015-08-31 ENCOUNTER — Encounter: Payer: Self-pay | Admitting: Cardiology

## 2015-08-31 VITALS — BP 124/80 | HR 111 | Ht 67.0 in | Wt 204.0 lb

## 2015-08-31 DIAGNOSIS — I10 Essential (primary) hypertension: Secondary | ICD-10-CM

## 2015-08-31 DIAGNOSIS — I5022 Chronic systolic (congestive) heart failure: Secondary | ICD-10-CM | POA: Diagnosis not present

## 2015-08-31 DIAGNOSIS — I429 Cardiomyopathy, unspecified: Secondary | ICD-10-CM

## 2015-08-31 NOTE — Patient Instructions (Signed)
Medications   NO CHANGE    Your physician recommends that you HAVE LAB WORK TODAY    Procedures   Your physician has requested that you have an echocardiogram. Echocardiography is a painless test that uses sound waves to create images of your heart. It provides your doctor with information about the size and shape of your heart and how well your heart's chambers and valves are working. This procedure takes approximately one hour. There are no restrictions for this procedure. PLEASE SCHEDULE AT THE [facility]    Follow-up WITH DR CRENSHAW IN 6 MONTHS WITH [facility]

## 2015-09-01 ENCOUNTER — Telehealth: Payer: Self-pay | Admitting: *Deleted

## 2015-09-01 ENCOUNTER — Telehealth: Payer: Self-pay | Admitting: Cardiology

## 2015-09-01 DIAGNOSIS — I420 Dilated cardiomyopathy: Secondary | ICD-10-CM

## 2015-09-01 DIAGNOSIS — I161 Hypertensive emergency: Secondary | ICD-10-CM

## 2015-09-01 DIAGNOSIS — I428 Other cardiomyopathies: Secondary | ICD-10-CM

## 2015-09-01 DIAGNOSIS — I5022 Chronic systolic (congestive) heart failure: Secondary | ICD-10-CM

## 2015-09-01 LAB — BASIC METABOLIC PANEL
BUN: 18 mg/dL (ref 7–25)
CO2: 22 mmol/L (ref 20–31)
CREATININE: 1.84 mg/dL — AB (ref 0.60–1.35)
Calcium: 10.5 mg/dL — ABNORMAL HIGH (ref 8.6–10.3)
Chloride: 96 mmol/L — ABNORMAL LOW (ref 98–110)
GLUCOSE: 448 mg/dL — AB (ref 65–99)
POTASSIUM: 4.3 mmol/L (ref 3.5–5.3)
Sodium: 133 mmol/L — ABNORMAL LOW (ref 135–146)

## 2015-09-01 LAB — BRAIN NATRIURETIC PEPTIDE: BRAIN NATRIURETIC PEPTIDE: 4.4 pg/mL (ref <100)

## 2015-09-01 NOTE — Telephone Encounter (Signed)
Fu primary care for DM management Olga Millers

## 2015-09-01 NOTE — Telephone Encounter (Signed)
Unable to reach pt or leave a message  

## 2015-09-01 NOTE — Telephone Encounter (Signed)
Alert results from [facility] - glucose 448 (repeated and verified)  Message routed to MD

## 2015-09-01 NOTE — Telephone Encounter (Signed)
-----   Message from Lewayne Bunting, MD sent at 09/01/2015 12:47 PM EDT ----- Change lasix to 20 mg daily. Repeat bmet 2 weeks Olga Millers

## 2015-09-01 NOTE — Telephone Encounter (Signed)
New Message  [facility] calling for STAT labs- Please call back and discuss.

## 2015-09-07 MED ORDER — FUROSEMIDE 20 MG PO TABS: 20.0000 mg | ORAL_TABLET | Freq: Every day | ORAL | Status: DC

## 2015-09-07 NOTE — Telephone Encounter (Signed)
Spoke with pt, aware of medication changes. New script sent to the pharmacy  Lab orders mailed to the pt  

## 2015-09-07 NOTE — Telephone Encounter (Signed)
Follow up ° ° ° ° ° °Returning a call to get lab results °

## 2015-09-08 ENCOUNTER — Ambulatory Visit (HOSPITAL_BASED_OUTPATIENT_CLINIC_OR_DEPARTMENT_OTHER)
Admission: RE | Admit: 2015-09-08 | Discharge: 2015-09-08 | Disposition: A | Discharge status: Home / Self Care | Payer: Medicaid Other | Source: Ambulatory Visit | Attending: Cardiology | Admitting: Cardiology

## 2015-09-08 DIAGNOSIS — I119 Hypertensive heart disease without heart failure: Secondary | ICD-10-CM | POA: Insufficient documentation

## 2015-09-08 DIAGNOSIS — E119 Type 2 diabetes mellitus without complications: Secondary | ICD-10-CM | POA: Diagnosis not present

## 2015-09-08 DIAGNOSIS — I429 Cardiomyopathy, unspecified: Secondary | ICD-10-CM

## 2015-09-08 NOTE — Progress Notes (Signed)
  Echocardiogram 2D Echocardiogram has been performed.  Cathie Beams 09/08/2015, 12:32 PM

## 2015-09-13 ENCOUNTER — Other Ambulatory Visit: Payer: Self-pay | Admitting: *Deleted

## 2015-09-13 ENCOUNTER — Telehealth: Payer: Self-pay | Admitting: Cardiology

## 2015-09-13 DIAGNOSIS — I428 Other cardiomyopathies: Secondary | ICD-10-CM

## 2015-09-13 MED ORDER — METOPROLOL SUCCINATE ER 100 MG PO TB24: 200.0000 mg | ORAL_TABLET | Freq: Every day | ORAL | 12 refills | Status: DC

## 2015-09-13 NOTE — Telephone Encounter (Signed)
New message          Pt c/o medication issue:  1. Name of Medication: Valsartan 2. How are you currently taking this medication (dosage and times per day)? 320 mg po  3. Are you having a reaction (difficulty breathing--STAT)? no  4. What is your medication issue? The pt needs a cheaper alternative to Valsartan from the MD , the pharmacy has being to reach the Md and nurse

## 2015-09-13 NOTE — Telephone Encounter (Signed)
Will forward for dr crenshaw review  

## 2015-09-13 NOTE — Telephone Encounter (Signed)
Dc valsartan, cozaar 100 mg daily Olga Millers

## 2015-09-14 MED ORDER — LOSARTAN POTASSIUM 100 MG PO TABS: 100.0000 mg | ORAL_TABLET | Freq: Every day | ORAL | 3 refills | Status: DC

## 2015-09-14 NOTE — Telephone Encounter (Signed)
New script sent to the pharmacy

## 2015-09-15 ENCOUNTER — Other Ambulatory Visit: Payer: Self-pay | Admitting: *Deleted

## 2015-09-15 DIAGNOSIS — I428 Other cardiomyopathies: Secondary | ICD-10-CM

## 2015-09-15 DIAGNOSIS — I420 Dilated cardiomyopathy: Secondary | ICD-10-CM

## 2015-09-15 DIAGNOSIS — I5022 Chronic systolic (congestive) heart failure: Secondary | ICD-10-CM

## 2015-09-15 DIAGNOSIS — I161 Hypertensive emergency: Secondary | ICD-10-CM

## 2015-09-15 MED ORDER — SPIRONOLACTONE 25 MG PO TABS: 25.0000 mg | ORAL_TABLET | Freq: Every day | ORAL | 5 refills | Status: DC

## 2015-09-15 MED ORDER — FUROSEMIDE 20 MG PO TABS: 20.0000 mg | ORAL_TABLET | Freq: Every day | ORAL | 5 refills | Status: DC

## 2015-09-15 MED ORDER — POTASSIUM CHLORIDE CRYS ER 20 MEQ PO TBCR: 20.0000 meq | EXTENDED_RELEASE_TABLET | Freq: Two times a day (BID) | ORAL | 5 refills | Status: DC

## 2015-09-15 MED ORDER — LISINOPRIL 40 MG PO TABS: 40.0000 mg | ORAL_TABLET | Freq: Every day | ORAL | 12 refills | Status: DC

## 2015-09-15 MED ORDER — METOPROLOL SUCCINATE ER 50 MG PO TB24: ORAL_TABLET | ORAL | Status: DC

## 2015-09-15 NOTE — Telephone Encounter (Signed)
Spoke with pt, Aware of dr crenshaw's recommendations.  °

## 2015-09-15 NOTE — Telephone Encounter (Signed)
Insurance will not cover cozaar unless the pt has taken an ACE and failed. I do not see where he has taken an ACE. Will forward for dr Jens Som review

## 2015-09-15 NOTE — Addendum Note (Signed)
Addended by: Freddi Starr on: 09/15/2015 04:44 PM   Modules accepted: Orders

## 2015-09-15 NOTE — Telephone Encounter (Signed)
Lisinopril 40 mg daily Olga Millers

## 2015-12-19 LAB — BASIC METABOLIC PANEL
BUN: 14 mg/dL (ref 7–25)
CHLORIDE: 103 mmol/L (ref 98–110)
CO2: 28 mmol/L (ref 20–31)
Calcium: 10 mg/dL (ref 8.6–10.3)
Creat: 0.8 mg/dL (ref 0.60–1.35)
GLUCOSE: 89 mg/dL (ref 65–99)
POTASSIUM: 4.1 mmol/L (ref 3.5–5.3)
SODIUM: 139 mmol/L (ref 135–146)

## 2016-01-30 ENCOUNTER — Other Ambulatory Visit: Payer: Self-pay | Admitting: *Deleted

## 2016-01-30 ENCOUNTER — Telehealth: Payer: Self-pay | Admitting: Cardiology

## 2016-01-30 DIAGNOSIS — I428 Other cardiomyopathies: Secondary | ICD-10-CM

## 2016-01-30 MED ORDER — HYDRALAZINE HCL 50 MG PO TABS: 75.0000 mg | ORAL_TABLET | Freq: Three times a day (TID) | ORAL | 3 refills | Status: DC

## 2016-01-30 MED ORDER — HYDRALAZINE HCL 25 MG PO TABS: 75.0000 mg | ORAL_TABLET | Freq: Three times a day (TID) | ORAL | 2 refills | Status: DC

## 2016-01-30 NOTE — Telephone Encounter (Signed)
New message       *STAT* If patient is at the pharmacy, call can be transferred to refill team.   1. Which medications need to be refilled? (please list name of each medication and dose if known)  Hydralazine 25mg ---pt takes 75mg  every 8 hrs.  Pt want 25mg  tablets instead of 50mg  tablets because it is hard to cut 50mg  tablets in half----will need new presc called in  2. Which pharmacy/location (including street and city if local pharmacy) is medication to be sent to? [facility] [address] in [place]  3. Do they need a 30 day or 90 day supply?  30 day supply------PT IS OUT OF MEDICATION

## 2016-01-30 NOTE — Telephone Encounter (Signed)
Attempted to reach patient to inform him of refill. No answer at home line or cell.

## 2016-02-23 NOTE — Progress Notes (Deleted)
HPI: FU cardiomyopathy. Patient was admitted in April 2016 to [facility] in [place] with exudative left pleural effusion secondary to pneumonia requiring VATS. Echocardiogram showed an ejection fraction of 15-20% and moderately reduced right ventricular function. Cardiac MRI showed severely reduced LV function with no evidence of myocardial infarction, myocarditis or infiltrative process. Right ventricular function moderate to severely reduced. Cardiomyopathy felt nonischemic. Renal Doppler September 2016 showed no stenosis. Admitted in August 2016 with severe hypertension and CHF. Medications adjusted. Echocardiogram repeated February 2017. Ejection fraction 40-45% and grade 1 diastolic dysfunction. Echocardiogram repeated July 2017 and showed normal LV function with grade 1 diastolic dysfunction. Previous reduction in LV function felt secondary to hypertension. Since last seen,   Current Outpatient Prescriptions  Medication Sig Dispense Refill  . acetaminophen (TYLENOL) 500 MG tablet Take 500 mg by mouth every 6 (six) hours as needed.    . dicyclomine (BENTYL) 10 MG capsule Take 10 mg by mouth 2 (two) times daily.     . famotidine (PEPCID) 20 MG tablet Take 20 mg by mouth 2 (two) times daily.    . furosemide (LASIX) 20 MG tablet Take 1 tablet (20 mg total) by mouth daily. 30 tablet 5  . glipiZIDE (GLUCOTROL) 10 MG tablet Take 1 tablet (10 mg total) by mouth daily before breakfast. 30 tablet 0  . hydrALAZINE (APRESOLINE) 25 MG tablet Take 3 tablets (75 mg total) by mouth every 8 (eight) hours. 270 tablet 2  . lisinopril (PRINIVIL,ZESTRIL) 40 MG tablet Take 1 tablet (40 mg total) by mouth daily. 30 tablet 12  . metoprolol succinate (TOPROL-XL) 50 MG 24 hr tablet 2 tablets once daily    . potassium chloride SA (K-DUR,KLOR-CON) 20 MEQ tablet Take 1 tablet (20 mEq total) by mouth 2 (two) times daily. 60 tablet 5  . spironolactone (ALDACTONE) 25 MG tablet Take 1 tablet (25 mg total) by  mouth daily. 30 tablet 5   No current facility-administered medications for this visit.      Past Medical History:  Diagnosis Date  . Cardiomyopathy (HCC)   . Dyskinesia dx'd 2000  . Environmental allergies   . GERD (gastroesophageal reflux disease)   . Hypertension   . IBS (irritable bowel syndrome)   . Pneumonia <2005 X 1; 05/2014  . Recurrent left pleural effusion   . Type II diabetes mellitus (HCC)     Past Surgical History:  Procedure Laterality Date  . LAPAROSCOPIC CHOLECYSTECTOMY  2005  . LUNG DECORTICATION Left 05/2014   . VIDEO ASSISTED THORACOSCOPY (VATS)/DECORTICATION  05/2014   "this time they used the camera"    Social History   Social History  . Marital status: Married    Spouse name: N/A  . Number of children: 3  . Years of education: N/A   Occupational History  . Not on file.   Social History Main Topics  . Smoking status: Never Smoker  . Smokeless tobacco: Never Used  . Alcohol use No  . Drug use: No  . Sexual activity: Not Currently   Other Topics Concern  . Not on file   Social History Narrative  . No narrative on file    Family History  Problem Relation Age of Onset  . Diabetes Father   . Heart disease Father     ROS: no fevers or chills, productive cough, hemoptysis, dysphasia, odynophagia, melena, hematochezia, dysuria, hematuria, rash, seizure activity, orthopnea, PND, pedal edema, claudication. Remaining systems are negative.  Physical Exam: Well-developed well-nourished in no  acute distress.  Skin is warm and dry.  HEENT is normal.  Neck is supple.  Chest is clear to auscultation with normal expansion.  Cardiovascular exam is regular rate and rhythm.  Abdominal exam nontender or distended. No masses palpated. Extremities show no edema. neuro grossly intact  ECG

## 2016-02-29 ENCOUNTER — Ambulatory Visit: Payer: Medicaid Other | Admitting: Cardiology

## 2016-03-29 NOTE — Progress Notes (Signed)
HPI: FU cardiomyopathy. Patient was admitted in April 2016 to [facility] in [place] with exudative left pleural effusion secondary to pneumonia requiring VATS. Echocardiogram showed an ejection fraction of 15-20% and moderately reduced right ventricular function. Cardiac MRI showed severely reduced LV function with no evidence of myocardial infarction, myocarditis or infiltrative process. Right ventricular function moderate to severely reduced. Cardiomyopathy felt nonischemic. Renal Doppler September 2016 showed no stenosis. Admitted in August 2016 with severe hypertension and CHF. Medications adjusted. Echocardiogram repeated July 2017 and LV function normalized with EF 55-60 and grade 1 diastolic dysfunction. Since last seen, the patient has dyspnea with more extreme activities but not with routine activities. It is relieved with rest. It is not associated with chest pain. There is no orthopnea, PND or pedal edema. There is no syncope or palpitations. There is no exertional chest pain.   Current Outpatient Prescriptions  Medication Sig Dispense Refill  . acetaminophen (TYLENOL) 500 MG tablet Take 500 mg by mouth every 6 (six) hours as needed.    . dicyclomine (BENTYL) 10 MG capsule Take 10 mg by mouth 2 (two) times daily.     . famotidine (PEPCID) 20 MG tablet Take 20 mg by mouth 2 (two) times daily.    . furosemide (LASIX) 20 MG tablet Take 1 tablet (20 mg total) by mouth daily. 30 tablet 5  . hydrALAZINE (APRESOLINE) 25 MG tablet Take 3 tablets (75 mg total) by mouth every 8 (eight) hours. 270 tablet 2  . lisinopril (PRINIVIL,ZESTRIL) 40 MG tablet Take 1 tablet (40 mg total) by mouth daily. 30 tablet 12  . metoprolol succinate (TOPROL-XL) 50 MG 24 hr tablet 2 tablets once daily    . potassium chloride SA (K-DUR,KLOR-CON) 20 MEQ tablet Take 1 tablet (20 mEq total) by mouth 2 (two) times daily. 60 tablet 5  . spironolactone (ALDACTONE) 25 MG tablet Take 1 tablet (25 mg total) by  mouth daily. 30 tablet 5   No current facility-administered medications for this visit.      Past Medical History:  Diagnosis Date  . Cardiomyopathy (HCC)   . Dyskinesia dx'd 2000  . Environmental allergies   . GERD (gastroesophageal reflux disease)   . Hypertension   . IBS (irritable bowel syndrome)   . Pneumonia <2005 X 1; 05/2014  . Recurrent left pleural effusion   . Type II diabetes mellitus (HCC)     Past Surgical History:  Procedure Laterality Date  . LAPAROSCOPIC CHOLECYSTECTOMY  2005  . LUNG DECORTICATION Left 05/2014   . VIDEO ASSISTED THORACOSCOPY (VATS)/DECORTICATION  05/2014   "this time they used the camera"    Social History   Social History  . Marital status: Married    Spouse name: N/A  . Number of children: 3  . Years of education: N/A   Occupational History  . Not on file.   Social History Main Topics  . Smoking status: Never Smoker  . Smokeless tobacco: Never Used  . Alcohol use No  . Drug use: No  . Sexual activity: Not Currently   Other Topics Concern  . Not on file   Social History Narrative  . No narrative on file    Family History  Problem Relation Age of Onset  . Diabetes Father   . Heart disease Father     ROS: no fevers or chills, productive cough, hemoptysis, dysphasia, odynophagia, melena, hematochezia, dysuria, hematuria, rash, seizure activity, orthopnea, PND, pedal edema, claudication. Remaining systems are negative.  Physical  Exam: Well-developed well-nourished in no acute distress.  Skin is warm and dry.  HEENT is normal.  Neck is supple. No bruits Chest is clear to auscultation with normal expansion.  Cardiovascular exam is regular rate and rhythm.  Abdominal exam nontender or distended. No masses palpated. Extremities show no edema. neuro grossly intact  ECG-Sinus rhythm at a rate of 76. Nonspecific ST changes.  A/P  1 cardiomyopathy-this is felt to be hypertensive mediated. His LV function has now  normalized. Continue Toprol, ACEI and hydralazine. Continue spironolactone.  2 chronic diastolic congestive heart failure-continue present dose of diuretics. Check potassium and renal function.  3 hypertension-diastolic blood pressure has been elevated at home. Increase hydralazine to 100 mg by mouth 3 times a day and follow.  Olga Millers, MD

## 2016-04-04 ENCOUNTER — Encounter: Payer: Self-pay | Admitting: Cardiology

## 2016-04-04 ENCOUNTER — Ambulatory Visit (INDEPENDENT_AMBULATORY_CARE_PROVIDER_SITE_OTHER): Payer: Medicaid Other | Admitting: Cardiology

## 2016-04-04 VITALS — BP 129/86 | HR 76 | Ht 67.0 in | Wt 205.1 lb

## 2016-04-04 DIAGNOSIS — I10 Essential (primary) hypertension: Secondary | ICD-10-CM | POA: Diagnosis not present

## 2016-04-04 DIAGNOSIS — I428 Other cardiomyopathies: Secondary | ICD-10-CM | POA: Diagnosis not present

## 2016-04-04 MED ORDER — HYDRALAZINE HCL 100 MG PO TABS: 100.0000 mg | ORAL_TABLET | Freq: Three times a day (TID) | ORAL | 3 refills | Status: DC

## 2016-04-04 NOTE — Patient Instructions (Signed)
Medication Instructions:   INCREASE HYDRALAZINE TO 100 MG THREE TIMES DAILY= 4 OF THE 25 MG TABLETS THREE TIMES DAILY  Labwork:  Your physician recommends that you HAVE LAB WORK TODAY  Follow-Up:  Your physician wants you to follow-up in: 6 MONTHS WITH DR Jens Som You will receive a reminder letter in the mail two months in advance. If you don't receive a letter, please call our office to schedule the follow-up appointment.   If you need a refill on your cardiac medications before your next appointment, please call your pharmacy.

## 2016-04-05 LAB — BASIC METABOLIC PANEL
BUN / CREAT RATIO: 17 (ref 9–20)
BUN: 19 mg/dL (ref 6–24)
CO2: 24 mmol/L (ref 18–29)
Calcium: 9.8 mg/dL (ref 8.7–10.2)
Chloride: 95 mmol/L — ABNORMAL LOW (ref 96–106)
Creatinine, Ser: 1.14 mg/dL (ref 0.76–1.27)
GFR calc Af Amer: 87 mL/min/{1.73_m2} (ref >59)
GFR, EST NON AFRICAN AMERICAN: 76 mL/min/{1.73_m2} (ref >59)
GLUCOSE: 298 mg/dL — AB (ref 65–99)
POTASSIUM: 4 mmol/L (ref 3.5–5.2)
SODIUM: 137 mmol/L (ref 134–144)

## 2016-08-27 IMAGING — CT CT ABD-PELV W/ CM
2 of 5 series · 16 of 46 positions shown, 18 images · IV contrast (APPLIED)
Comparison: None.

CLINICAL DATA: Abdominal pain for 3 weeks, nausea and bloating,
diabetes, IBS, status postcholecystectomy

EXAM:
CT ABDOMEN AND PELVIS WITH CONTRAST
TECHNIQUE: Multidetector CT imaging of the abdomen and pelvis was performed
using the standard protocol following bolus administration of
intravenous contrast.
CONTRAST:  25mL OMNIPAQUE IOHEXOL 300 MG/ML SOLN, 100mL OMNIPAQUE
IOHEXOL 300 MG/ML SOLN

[Series 2: abd/pelvis 5.0 b31f · axial · 0.77mm/px · z∈[+877,+1322]mm · 13 of 101 slices shown, 15 images]
[im 6/101  soft-tissue]
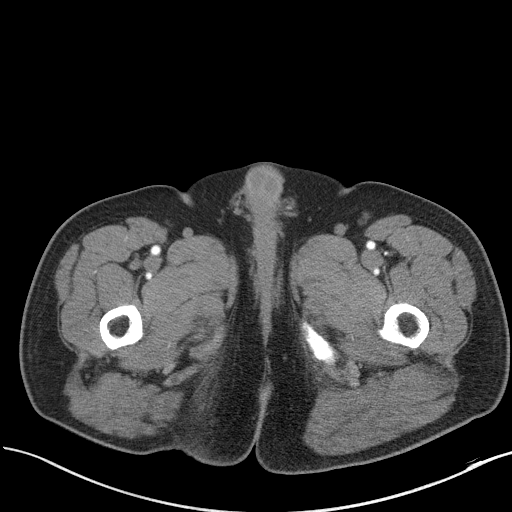
[im 6/101  bone]
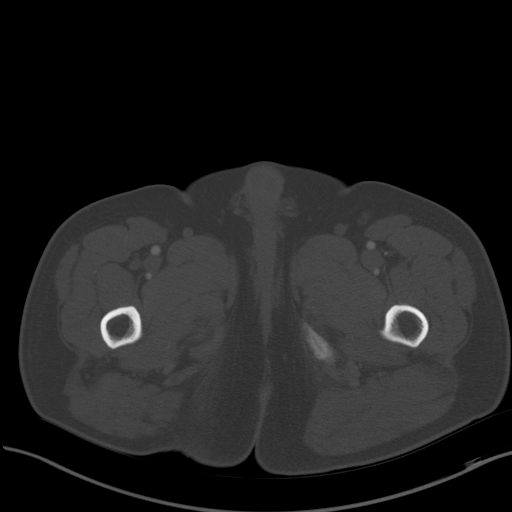
[im 16/101  soft-tissue]
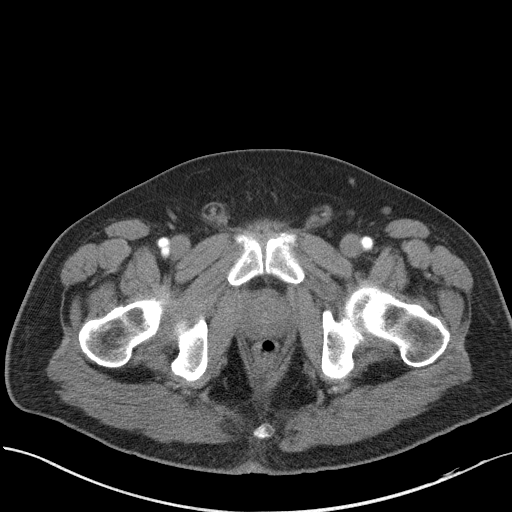
[im 22/101  soft-tissue]
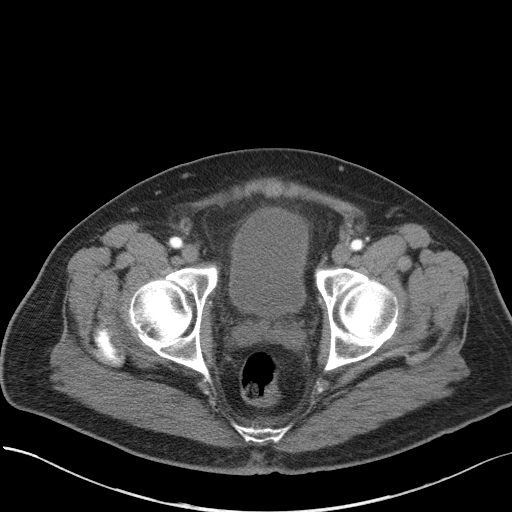
[im 27/101  soft-tissue]
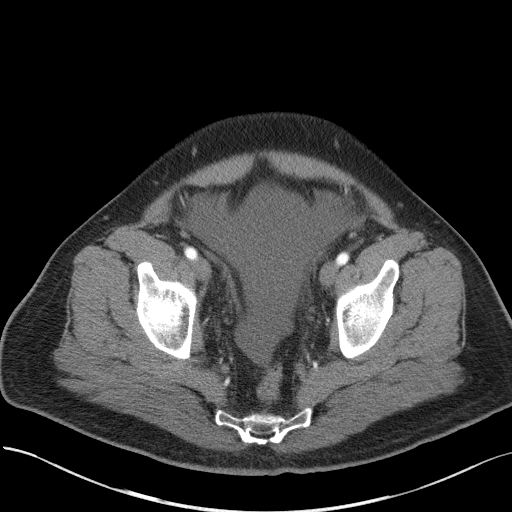
[im 37/101  soft-tissue]
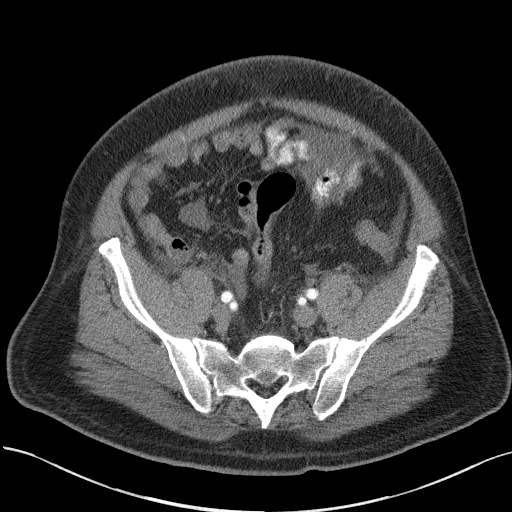
[im 43/101  soft-tissue]
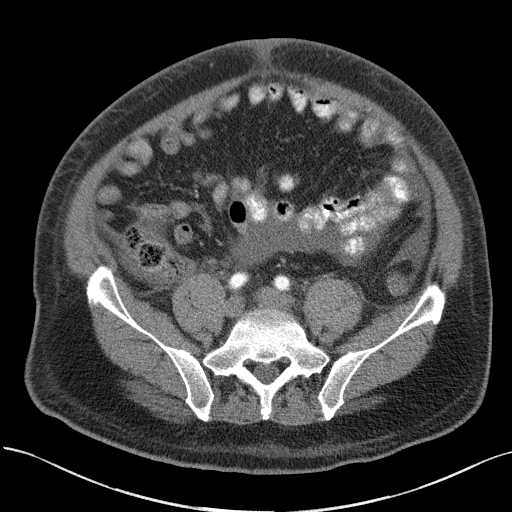
[im 53/101  soft-tissue]
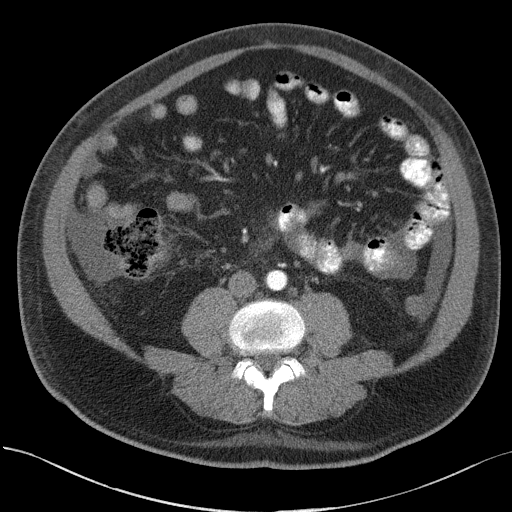
[im 58/101  soft-tissue]
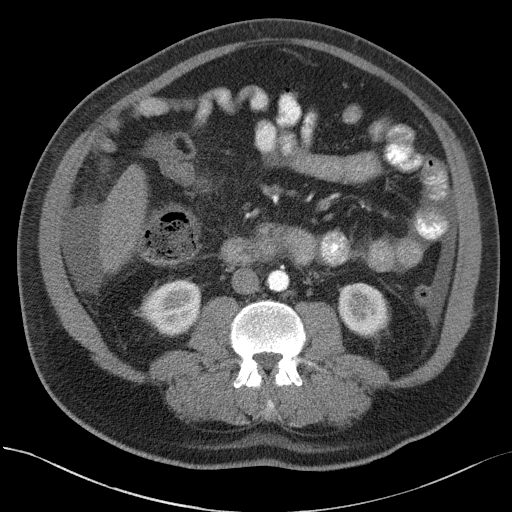
[im 64/101  soft-tissue]
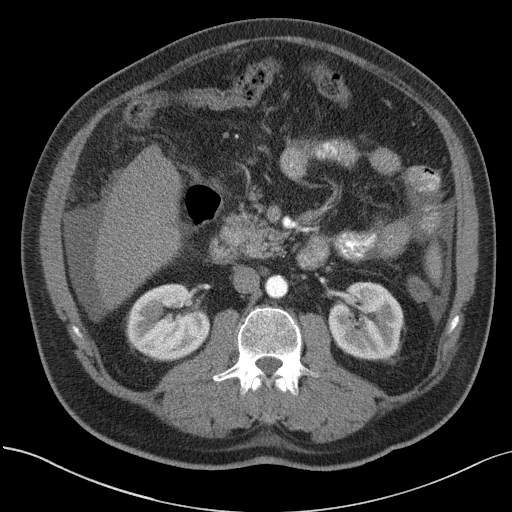
[im 64/101  bone]
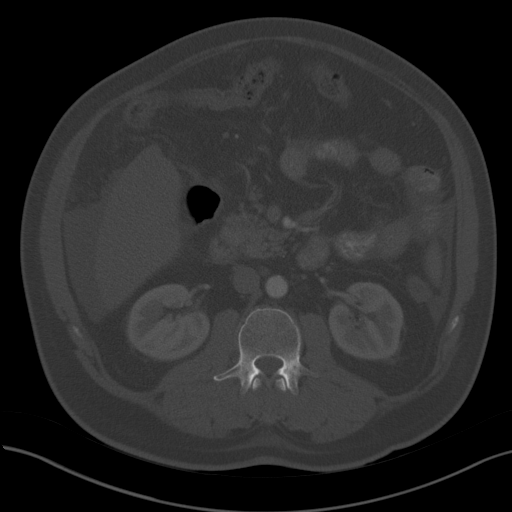
[im 74/101  soft-tissue]
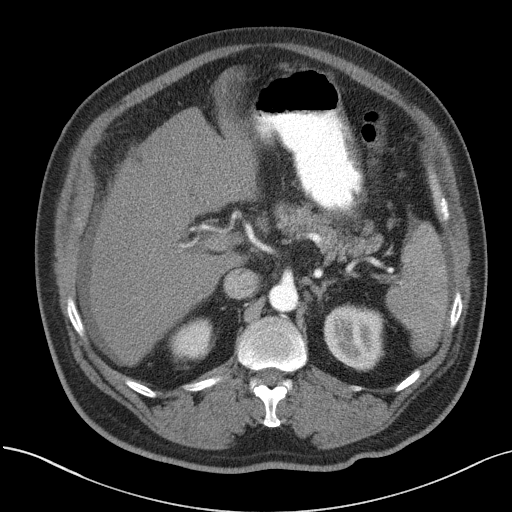
[im 79/101  soft-tissue]
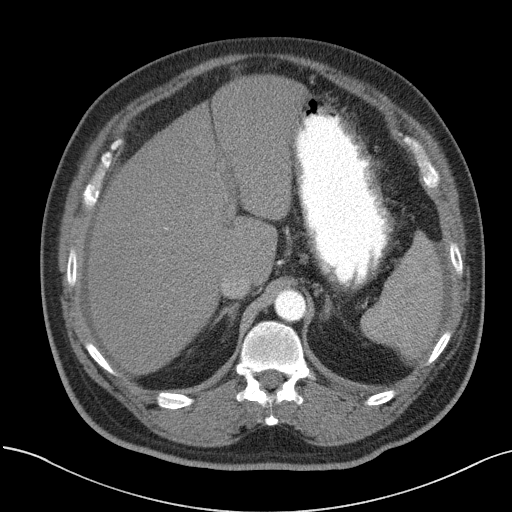
[im 85/101  soft-tissue]
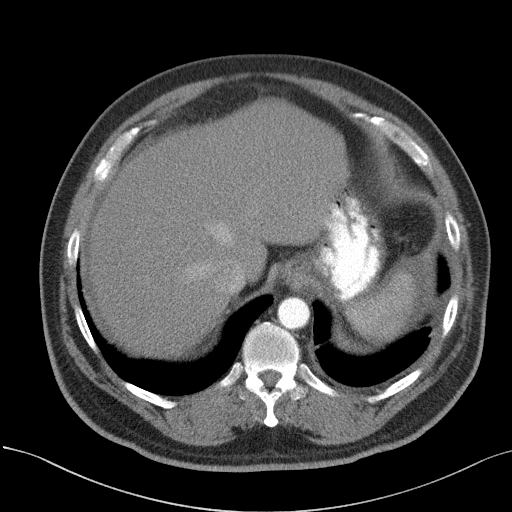
[im 95/101  soft-tissue]
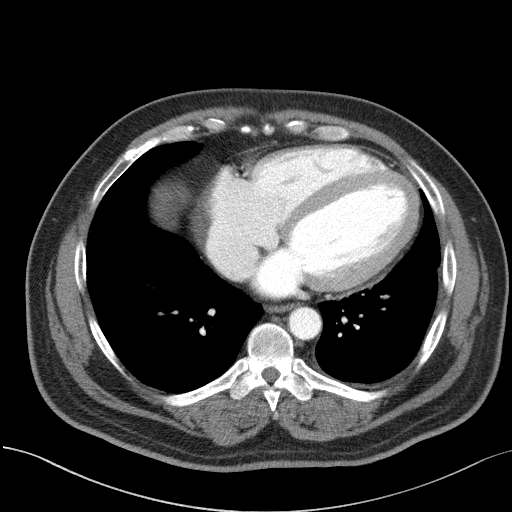

[Series 5: abd/pelvis 3.0 coronal · coronal · 0.77mm/px · 3 of 117 slices shown]
[im 39/117  soft-tissue]
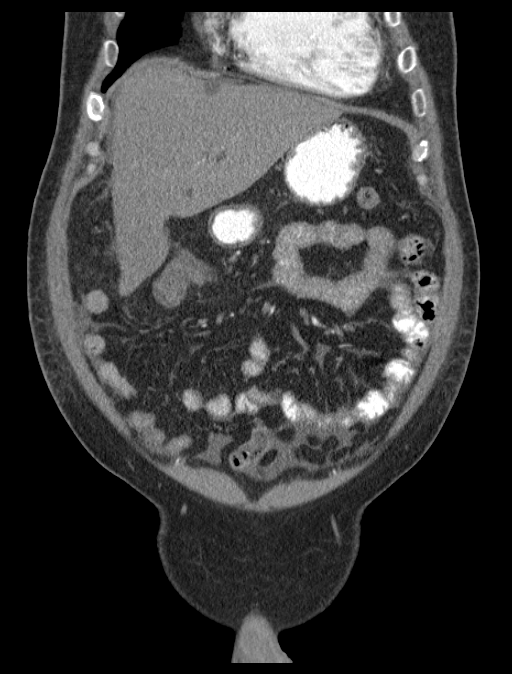
[im 52/117  soft-tissue]
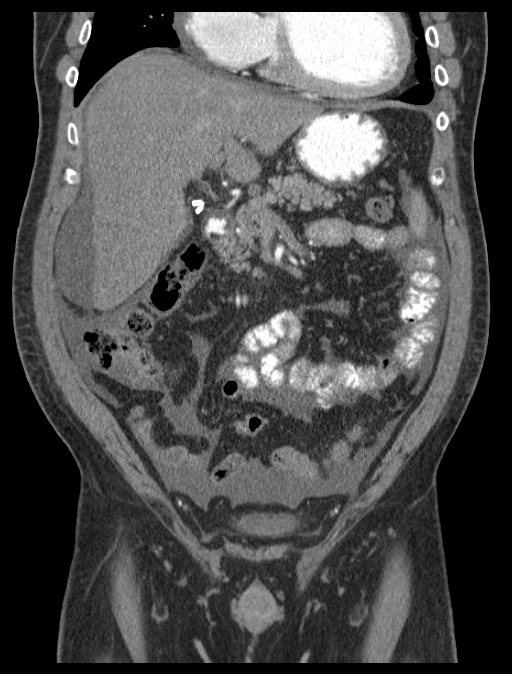
[im 65/117  soft-tissue]
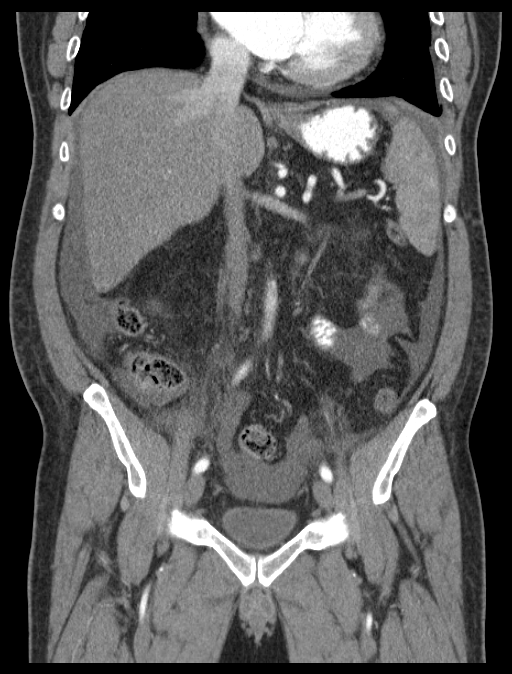

[16 of 46 positions shown; findings below may reference images not displayed]

FINDINGS: Lung bases are unremarkable. Sagittal images of the spine shows mild
degenerative changes lower thoracic spine. No aortic aneurysm. Fatty
infiltration of the liver. Few scattered hepatic cysts are noted the
largest in left hepatic dome measures 1.9 cm. No intrahepatic
biliary ductal dilatation. Small pericardial effusion. There is
small perihepatic and perisplenic ascites. Postcholecystectomy
surgical clips are noted. The pancreas, spleen and adrenal glands
are unremarkable. Kidneys are symmetrical in size and enhancement.
No hydronephrosis or hydroureter. There is nonobstructive calculus
in upper pole of the right kidney measures 4 mm.

Delayed renal images shows bilateral renal symmetrical excretion.

There is no small bowel obstruction. Small ascites is noted in
bilateral paracolic gutter. No pericecal inflammation. The terminal
ileum is unremarkable. Moderate pelvic ascites. There is no free
abdominal air. Prostate gland and seminal vesicles are unremarkable.
Bilateral distal ureter is unremarkable. The urinary bladder is
unremarkable.

The appendix is not identified. The SMA and celiac trunk are patent.
Bilateral iliac arteries are patent.
IMPRESSION: 1. There is small pericardial effusion. Small perihepatic and
perisplenic ascites. Small ascites noted bilateral paracolic
gutters. Moderate pelvic ascites.
2. Fatty infiltration of the liver. Scattered hepatic cysts the
largest measures 1.9 cm.
3. No hydronephrosis or hydroureter.
4. No small bowel obstruction
5. Status postcholecystectomy.

## 2016-10-25 ENCOUNTER — Telehealth: Payer: Self-pay | Admitting: Cardiology

## 2016-10-25 NOTE — Telephone Encounter (Signed)
New message      *STAT* If patient is at the pharmacy, call can be transferred to refill team.   1. Which medications need to be refilled? (please list name of each medication and dose if known) lisinopril (PRINIVIL,ZESTRIL) 40 MG tablet(Expired) Take 1 tablet (40 mg total) by mouth daily       2. Which pharmacy/location (including street and city if local pharmacy) is medication to be sent to? [facility] on [address] [place]   3. Do they need a 30 day or 90 day supply?  30

## 2016-10-26 MED ORDER — LISINOPRIL 40 MG PO TABS: 40.0000 mg | ORAL_TABLET | Freq: Every day | ORAL | 12 refills | Status: DC

## 2016-10-26 NOTE — Telephone Encounter (Signed)
Refill sent to the pharmacy electronically.  

## 2016-11-11 NOTE — Progress Notes (Signed)
Cardiology Office Note    Date:  11/12/2016   ID:  Richard Vance, DOB [birth year], MRN [id number]  PCP:  Patient, No Pcp Per  Cardiologist: Dr. Jens Som  Chief Complaint  Patient presents with  . Follow-up    6 months    History of Present Illness:    Richard Vance is a 51 y.o. male with past medical history of nonischemic cardiomyopathy (EF 15-20% by echo in 2016 and improved to 55-60% by echo in 08/2015, felt to be secondary to HTN), chronic diastolic CHF, and HTN who presents to the office today for 81-month follow-up.   He was last examined by Dr. Jens Som in 03/2016 and reported dyspnea with extreme activities but not on a daily basis. He denied any recent chest pain, orthopnea, or lower extremity edema. He was continued on Toprol-XL 100mg  daily, Spironolactone 25mg  daily, Lasix 20mg  daily, and Lisinopril 40mg  daily. Hydralazine was increased to 100mg  TID to assist with BP control.   In talking with the patient today, he reports overall doing very well since his last office visit. He denies any recent chest discomfort, dyspnea on exertion, orthopnea, PND, palpitations, or presyncope. He does experience lower extremity edema at times and reports this resolves with taking an additional Lasix tablet.  He brings with him today a very detailed log of his blood pressure readings with systolics mostly in the low 100' to 110's and diastolics in the 70's to 80's. He is currently unemployed and on a fixed income, therefore he asks to be switched to a lower cost Metoprolol if possible as he was notified by his Pharmacist that the short-acting version would be more financially reasonable.   Past Medical History:  Diagnosis Date  . Cardiomyopathy (HCC)   . Dyskinesia dx'd 2000  . Environmental allergies   . GERD (gastroesophageal reflux disease)   . Hypertension   . IBS (irritable bowel syndrome)   . Nonischemic cardiomyopathy (HCC)    a. EF 15-20% by echo in 2016 and improved to 55-60% by  echo in 08/2015, felt to be secondary to HTN  . Pneumonia <2005 X 1; 05/2014  . Recurrent left pleural effusion   . Type II diabetes mellitus (HCC)     Past Surgical History:  Procedure Laterality Date  . LAPAROSCOPIC CHOLECYSTECTOMY  2005  . LUNG DECORTICATION Left 05/2014   . VIDEO ASSISTED THORACOSCOPY (VATS)/DECORTICATION  05/2014   "this time they used the camera"    Current Medications: Outpatient Medications Prior to Visit  Medication Sig Dispense Refill  . acetaminophen (TYLENOL) 500 MG tablet Take 500 mg by mouth every 6 (six) hours as needed.    . dicyclomine (BENTYL) 10 MG capsule Take 10 mg by mouth 2 (two) times daily.     . famotidine (PEPCID) 20 MG tablet Take 20 mg by mouth 2 (two) times daily.    . potassium chloride SA (K-DUR,KLOR-CON) 20 MEQ tablet Take 1 tablet (20 mEq total) by mouth 2 (two) times daily. 60 tablet 5  . furosemide (LASIX) 20 MG tablet Take 1 tablet (20 mg total) by mouth daily. 30 tablet 5  . hydrALAZINE (APRESOLINE) 100 MG tablet Take 1 tablet (100 mg total) by mouth 3 (three) times daily. 270 tablet 3  . lisinopril (PRINIVIL,ZESTRIL) 40 MG tablet Take 1 tablet (40 mg total) by mouth daily. 30 tablet 12  . metoprolol succinate (TOPROL-XL) 50 MG 24 hr tablet 2 tablets once daily    . spironolactone (ALDACTONE) 25 MG tablet Take 1 tablet (  25 mg total) by mouth daily. 30 tablet 5   No facility-administered medications prior to visit.      Allergies:   Dust mite extract and Other   Social History   Social History  . Marital status: Married    Spouse name: N/A  . Number of children: 3  . Years of education: N/A   Social History Main Topics  . Smoking status: Never Smoker  . Smokeless tobacco: Never Used  . Alcohol use No  . Drug use: No  . Sexual activity: Not Currently   Other Topics Concern  . None   Social History Narrative  . None     Family History:  The patient's family history includes Diabetes in his father; Heart disease in  his father.   Review of Systems:   Please see the history of present illness.     General:  No chills, fever, night sweats or weight changes.  Cardiovascular:  No chest pain, dyspnea on exertion, orthopnea, palpitations, paroxysmal nocturnal dyspnea. Positive for edema.  Dermatological: No rash, lesions/masses Respiratory: No cough, dyspnea Urologic: No hematuria, dysuria Abdominal:   No nausea, vomiting, diarrhea, bright red blood per rectum, melena, or hematemesis Neurologic:  No visual changes, wkns, changes in mental status. All other systems reviewed and are otherwise negative except as noted above.   Physical Exam:    VS:  BP 108/76   Pulse 74   Ht  (1.702 m)   Wt 201 lb 12.8 oz (91.5 kg)   BMI 31.61 kg/m    General: Well developed, well nourished Caucasian male appearing in no acute distress. Head: Normocephalic, atraumatic, sclera non-icteric, no xanthomas, nares are without discharge.  Neck: No carotid bruits. JVD not elevated.  Lungs: Respirations regular and unlabored, without wheezes or rales.  Heart: Regular rate and rhythm. No S3 or S4.  No murmur, no rubs, or gallops appreciated. Abdomen: Soft, non-tender, non-distended with normoactive bowel sounds. No hepatomegaly. No rebound/guarding. No obvious abdominal masses. Msk:  Strength and tone appear normal for age. No joint deformities or effusions. Extremities: No clubbing or cyanosis. No lower extremity edema.  Distal pedal pulses are 2+ bilaterally. Neuro: Alert and oriented X 3. Moves all extremities spontaneously. No focal deficits noted. Psych:  Responds to questions appropriately with a normal affect. Skin: No rashes or lesions noted  Wt Readings from Last 3 Encounters:  11/12/16 201 lb 12.8 oz (91.5 kg)  04/04/16 205 lb 1.9 oz (93 kg)  08/31/15 204 lb (92.5 kg)     Studies/Labs Reviewed:   EKG:  EKG is not ordered today.   Recent Labs: 04/04/2016: BUN 19; Creatinine, Ser 1.14; Potassium 4.0;  Sodium 137   Lipid Panel    Component Value Date/Time   CHOL 196 10/21/2014 0545   TRIG 154 (H) 10/21/2014 0545   HDL 25 (L) 10/21/2014 0545   CHOLHDL 7.8 10/21/2014 0545   VLDL 31 10/21/2014 0545   LDLCALC 140 (H) 10/21/2014 0545    Additional studies/ records that were reviewed today include:   Echocardiogram: 08/2015 Study Conclusions  - Left ventricle: The cavity size was normal. There was mild   concentric hypertrophy. Systolic function was normal. The   estimated ejection fraction was in the range of 55% to 60%. Wall   motion was normal; there were no regional wall motion   abnormalities. There was an increased relative contribution of   atrial contraction to ventricular filling. Doppler parameters are   consistent with abnormal left ventricular  relaxation (grade 1   diastolic dysfunction).   Assessment:    1. Chronic diastolic congestive heart failure (HCC)   2. NICM (nonischemic cardiomyopathy) (HCC)   3. Essential hypertension   4. Gastroesophageal reflux disease without esophagitis   5. Medication management      Plan:   In order of problems listed above:  1. Chronic Diastolic CHF/ History of Nonischemic Cardiomyopathy - EF was previously 15-20% by echo in 2016, thought to be secondary to hypertensive urgency. Repeat echo in 08/2015 showed an improved EF of 55-60%. - he does not appear volume overloaded by physical examination.  - continue Lopressor (will switch from Toprol-XL to short-acting Lopressor due to financial difficulties), Lisinopril  daily, and Spironolactone  daily. Continue Lasix  daily. Will recheck BMET to assess K+ levels and kidney function.   2. HTN  - BP is well-controlled at 108/76 during today's visit and he reports similar readings at home. - continue current medication regimen with change from Metoprolol Succinate to Metoprolol Tartrate.   3. GERD - continue Pepcid. Followed by PCP.    Medication Adjustments/Labs  and Tests Ordered: Current medicines are reviewed at length with the patient today.  Concerns regarding medicines are outlined above.  Medication changes, Labs and Tests ordered today are listed in the Patient Instructions below. Patient Instructions  Medication Instructions:  STOP- Metoprolol Succinate START- Metoprolol Tartrate  If you need a refill on your cardiac medications before your next appointment, please call your pharmacy.  Labwork: BMP HERE IN OUR OFFICE AT [facility]  Testing/Procedures: Nine Ordered  Follow-Up: Your physician wants you to follow-up in: 1 Year. You should receive a reminder letter in the mail two months in advance. If you do not receive a letter, please call our office [phone].    Special Instructions: GoodRx for prescription coupon.  Thank you for choosing [facility] at [employer], [place], [place]  11/12/2016 4:13 PM    [facility] [address] [place], [address] [place], [place]  [address] Phone: [phone]; Fax: [phone]  [address] [place], [place] [address] Phone: [phone]

## 2016-11-12 ENCOUNTER — Ambulatory Visit (INDEPENDENT_AMBULATORY_CARE_PROVIDER_SITE_OTHER): Payer: Self-pay | Admitting: Student

## 2016-11-12 ENCOUNTER — Encounter: Payer: Self-pay | Admitting: Student

## 2016-11-12 VITALS — BP 108/76 | HR 74 | Ht 67.0 in | Wt 201.8 lb

## 2016-11-12 DIAGNOSIS — I428 Other cardiomyopathies: Secondary | ICD-10-CM

## 2016-11-12 DIAGNOSIS — I10 Essential (primary) hypertension: Secondary | ICD-10-CM

## 2016-11-12 DIAGNOSIS — K219 Gastro-esophageal reflux disease without esophagitis: Secondary | ICD-10-CM

## 2016-11-12 DIAGNOSIS — I5032 Chronic diastolic (congestive) heart failure: Secondary | ICD-10-CM

## 2016-11-12 DIAGNOSIS — Z79899 Other long term (current) drug therapy: Secondary | ICD-10-CM

## 2016-11-12 MED ORDER — LISINOPRIL 40 MG PO TABS: 40.0000 mg | ORAL_TABLET | Freq: Every day | ORAL | 3 refills | Status: DC

## 2016-11-12 MED ORDER — METOPROLOL TARTRATE 50 MG PO TABS: 50.0000 mg | ORAL_TABLET | Freq: Two times a day (BID) | ORAL | 3 refills | Status: DC

## 2016-11-12 MED ORDER — HYDRALAZINE HCL 100 MG PO TABS: 100.0000 mg | ORAL_TABLET | Freq: Three times a day (TID) | ORAL | 3 refills | Status: DC

## 2016-11-12 MED ORDER — FUROSEMIDE 20 MG PO TABS: 20.0000 mg | ORAL_TABLET | Freq: Every day | ORAL | 3 refills | Status: DC

## 2016-11-12 MED ORDER — SPIRONOLACTONE 25 MG PO TABS: 25.0000 mg | ORAL_TABLET | Freq: Every day | ORAL | 3 refills | Status: DC

## 2016-11-12 NOTE — Patient Instructions (Signed)
Medication Instructions:  STOP- Metoprolol Succinate START- Metoprolol Tartrate  If you need a refill on your cardiac medications before your next appointment, please call your pharmacy.  Labwork: BMP HERE IN OUR OFFICE AT [facility]  Testing/Procedures: Nine Ordered  Follow-Up: Your physician wants you to follow-up in: 1 Year. You should receive a reminder letter in the mail two months in advance. If you do not receive a letter, please call our office [phone].    Special Instructions: GoodRx for prescription coupon.  Thank you for choosing [facility] at [facility]!!

## 2016-11-13 LAB — BASIC METABOLIC PANEL
BUN/Creatinine Ratio: 16 (ref 9–20)
BUN: 19 mg/dL (ref 6–24)
CALCIUM: 9.7 mg/dL (ref 8.7–10.2)
CHLORIDE: 99 mmol/L (ref 96–106)
CO2: 21 mmol/L (ref 20–29)
Creatinine, Ser: 1.17 mg/dL (ref 0.76–1.27)
GFR calc Af Amer: 85 mL/min/{1.73_m2} (ref >59)
GFR, EST NON AFRICAN AMERICAN: 73 mL/min/{1.73_m2} (ref >59)
Glucose: 254 mg/dL — ABNORMAL HIGH (ref 65–99)
POTASSIUM: 4.2 mmol/L (ref 3.5–5.2)
Sodium: 138 mmol/L (ref 134–144)

## 2017-11-12 ENCOUNTER — Other Ambulatory Visit: Payer: Self-pay | Admitting: Student

## 2017-11-28 ENCOUNTER — Other Ambulatory Visit: Payer: Self-pay | Admitting: Student

## 2017-12-04 ENCOUNTER — Other Ambulatory Visit: Payer: Self-pay | Admitting: Student

## 2017-12-04 NOTE — Telephone Encounter (Signed)
Follow up:   Patient calling back concerning his medication.

## 2017-12-04 NOTE — Telephone Encounter (Signed)
Rx request sent to pharmacy.  

## 2017-12-04 NOTE — Telephone Encounter (Signed)
Unable to reach pt or leave a message mailbox is full 

## 2017-12-05 ENCOUNTER — Telehealth: Payer: Self-pay | Admitting: Cardiology

## 2017-12-05 ENCOUNTER — Other Ambulatory Visit: Payer: Self-pay | Admitting: Cardiology

## 2017-12-05 MED ORDER — HYDRALAZINE HCL 100 MG PO TABS: 100.0000 mg | ORAL_TABLET | Freq: Three times a day (TID) | ORAL | 0 refills | Status: DC

## 2017-12-05 MED ORDER — SPIRONOLACTONE 25 MG PO TABS: ORAL_TABLET | ORAL | 0 refills | Status: DC

## 2017-12-05 MED ORDER — FUROSEMIDE 20 MG PO TABS: ORAL_TABLET | ORAL | 0 refills | Status: DC

## 2017-12-05 NOTE — Telephone Encounter (Signed)
Spoke to patient, he is aware he is OV for follow up and this is scheduled.  Advised will refill until upcoming appt.   rx sent to pharmacy as requested.

## 2017-12-05 NOTE — Telephone Encounter (Signed)
New Message  Pt states he wants the medications sent to 2 different pharmacy  *STAT* If patient is at the pharmacy, call can be transferred to refill team.   1. Which medications need to be refilled? (please list name of each medication and dose if known) hydrALAZINE (APRESOLINE) 100 MG tablet  2. Which pharmacy/location (including street and city if local pharmacy) is medication to be sent to? [facility] - [place], [place] - [address]  3. Do they need a 30 day or 90 day supply? 90     *STAT* If patient is at the pharmacy, call can be transferred to refill team.   1. Which medications need to be refilled? (please list name of each medication and dose if known) furosemide (LASIX) 20 MG tablet and spironolactone (ALDACTONE) 25 MG tablet  2. Which pharmacy/location (including street and city if local pharmacy) is medication to be sent to? [id number] - [place], [place] - [address] AT [facility] [address]  3. Do they need a 30 day or 90 day supply? 90

## 2018-01-09 NOTE — Progress Notes (Signed)
HPI: FU cardiomyopathy. Patient was admitted in April 2016 to [facility] in [place] with exudative left pleural effusion secondary to pneumonia requiring VATS. Echocardiogram showed an ejection fraction of 15-20% and moderately reduced right ventricular function. Cardiac MRI showed severely reduced LV function with no evidence of myocardial infarction, myocarditis or infiltrative process. Right ventricular function moderate to severely reduced. Cardiomyopathy felt nonischemic. Renal Doppler September 2016 showed no stenosis. Admitted in August 2016 with severe hypertension and CHF. Medications adjusted. Echocardiogram repeated July 2017 and LV function normalized with EF 55-60 and grade 1 diastolic dysfunction. Since last seen, the patient has dyspnea with more extreme activities but not with routine activities. It is relieved with rest. It is not associated with chest pain. There is no orthopnea, PND or pedal edema. There is no syncope or palpitations. There is no exertional chest pain.   Current Outpatient Medications  Medication Sig Dispense Refill  . acetaminophen (TYLENOL) 500 MG tablet Take 500 mg by mouth every 6 (six) hours as needed.    . dicyclomine (BENTYL) 10 MG capsule Take 10 mg by mouth 2 (two) times daily.     . famotidine (PEPCID) 20 MG tablet Take 20 mg by mouth daily.     . furosemide (LASIX) 20 MG tablet TAKE 1 TABLET(20 MG) BY MOUTH DAILY, please keep appt for further refills 90 tablet 0  . hydrALAZINE (APRESOLINE) 100 MG tablet Take 1 tablet (100 mg total) by mouth 3 (three) times daily. Please keep upcoming appt for further refills 270 tablet 0  . lisinopril (PRINIVIL,ZESTRIL) 40 MG tablet TAKE 1 TABLET(40 MG) BY MOUTH DAILY 90 tablet 3  . metoprolol tartrate (LOPRESSOR) 50 MG tablet TAKE 1 TABLET(50 MG) BY MOUTH TWICE DAILY 180 tablet 2  . spironolactone (ALDACTONE) 25 MG tablet TAKE 1 TABLET BY MOUTH DAILY, PLEASE KEEP APPOINTMENT FOR FURTHER REFILLS 90 tablet 0    No current facility-administered medications for this visit.      Past Medical History:  Diagnosis Date  . Cardiomyopathy (HCC)   . Dyskinesia dx'd 2000  . Environmental allergies   . GERD (gastroesophageal reflux disease)   . Hypertension   . IBS (irritable bowel syndrome)   . Nonischemic cardiomyopathy (HCC)    a. EF 15-20% by echo in 2016 and improved to 55-60% by echo in 08/2015, felt to be secondary to HTN  . Pneumonia <2005 X 1; 05/2014  . Recurrent left pleural effusion   . Type II diabetes mellitus (HCC)     Past Surgical History:  Procedure Laterality Date  . LAPAROSCOPIC CHOLECYSTECTOMY  2005  . LUNG DECORTICATION Left 05/2014   . VIDEO ASSISTED THORACOSCOPY (VATS)/DECORTICATION  05/2014   "this time they used the camera"    Social History   Socioeconomic History  . Marital status: Married    Spouse name: Not on file  . Number of children: 3  . Years of education: Not on file  . Highest education level: Not on file  Occupational History  . Not on file  Social Needs  . Financial resource strain: Not on file  . Food insecurity:    Worry: Not on file    Inability: Not on file  . Transportation needs:    Medical: Not on file    Non-medical: Not on file  Tobacco Use  . Smoking status: Never Smoker  . Smokeless tobacco: Never Used  Substance and Sexual Activity  . Alcohol use: No  . Drug use: No  .  Sexual activity: Not Currently  Lifestyle  . Physical activity:    Days per week: Not on file    Minutes per session: Not on file  . Stress: Not on file  Relationships  . Social connections:    Talks on phone: Not on file    Gets together: Not on file    Attends religious service: Not on file    Active member of club or organization: Not on file    Attends meetings of clubs or organizations: Not on file    Relationship status: Not on file  . Intimate partner violence:    Fear of current or ex partner: Not on file    Emotionally abused: Not on file     Physically abused: Not on file    Forced sexual activity: Not on file  Other Topics Concern  . Not on file  Social History Narrative  . Not on file    Family History  Problem Relation Age of Onset  . Diabetes Father   . Heart disease Father     ROS: no fevers or chills, productive cough, hemoptysis, dysphasia, odynophagia, melena, hematochezia, dysuria, hematuria, rash, seizure activity, orthopnea, PND, pedal edema, claudication. Remaining systems are negative.  Physical Exam: Well-developed well-nourished in no acute distress.  Skin is warm and dry.  HEENT is normal.  Neck is supple.  Chest is clear to auscultation with normal expansion.  Cardiovascular exam is regular rate and rhythm.  Abdominal exam nontender or distended. No masses palpated. Extremities show no edema. neuro grossly intact  ECG-normal sinus rhythm at a rate of 88, left axis deviation, no ST changes.  Personally reviewed  A/P  1 cardiomyopathy-this was felt to be hypertensive mediated and has now improved.  Plan to continue with Toprol, ACE inhibitor, Spironolactone and hydralazine.  Repeat echocardiogram.  2 chronic diastolic congestive heart failure-patient appears to be euvolemic on examination.  Continue present dose of diuretic.  We discussed the importance of fluid restriction and low-sodium diet.  Check potassium and renal function.    3 hypertension-blood pressure mildly elevated.  Increase metoprolol to 75 mg twice daily and follow.  Olga Millers, MD

## 2018-01-21 ENCOUNTER — Ambulatory Visit (INDEPENDENT_AMBULATORY_CARE_PROVIDER_SITE_OTHER): Payer: Self-pay | Admitting: Cardiology

## 2018-01-21 ENCOUNTER — Encounter: Payer: Self-pay | Admitting: Cardiology

## 2018-01-21 VITALS — BP 138/90 | HR 88 | Ht 67.0 in | Wt 203.0 lb

## 2018-01-21 DIAGNOSIS — I428 Other cardiomyopathies: Secondary | ICD-10-CM

## 2018-01-21 DIAGNOSIS — I10 Essential (primary) hypertension: Secondary | ICD-10-CM

## 2018-01-21 DIAGNOSIS — I5032 Chronic diastolic (congestive) heart failure: Secondary | ICD-10-CM

## 2018-01-21 LAB — BASIC METABOLIC PANEL
BUN/Creatinine Ratio: 15 (ref 9–20)
BUN: 15 mg/dL (ref 6–24)
CO2: 21 mmol/L (ref 20–29)
Calcium: 9.6 mg/dL (ref 8.7–10.2)
Chloride: 98 mmol/L (ref 96–106)
Creatinine, Ser: 1.03 mg/dL (ref 0.76–1.27)
GFR calc Af Amer: 97 mL/min/{1.73_m2} (ref >59)
GFR calc non Af Amer: 84 mL/min/{1.73_m2} (ref >59)
Glucose: 265 mg/dL — ABNORMAL HIGH (ref 65–99)
Potassium: 3.7 mmol/L (ref 3.5–5.2)
Sodium: 138 mmol/L (ref 134–144)

## 2018-01-21 MED ORDER — METOPROLOL TARTRATE 75 MG PO TABS: 75.0000 mg | ORAL_TABLET | Freq: Two times a day (BID) | ORAL | 3 refills | Status: DC

## 2018-01-21 MED ORDER — LISINOPRIL 40 MG PO TABS: ORAL_TABLET | ORAL | 3 refills | Status: DC

## 2018-01-21 MED ORDER — FUROSEMIDE 20 MG PO TABS: ORAL_TABLET | ORAL | 3 refills | Status: DC

## 2018-01-21 MED ORDER — SPIRONOLACTONE 25 MG PO TABS: ORAL_TABLET | ORAL | 3 refills | Status: DC

## 2018-01-21 NOTE — Patient Instructions (Signed)
Medication Instructions:  INCREASE METOPROLOL TO 75 MG TWICE DAILY= 1 AND 1/2 OF THE 50 MG TABLET TWICE DAILY If you need a refill on your cardiac medications before your next appointment, please call your pharmacy.   Lab work: Your physician recommends that you HAVE LAB WORK TODAY If you have labs (blood work) drawn today and your tests are completely normal, you will receive your results only by: Marland Kitchen MyChart Message (if you have MyChart) OR . A paper copy in the mail If you have any lab test that is abnormal or we need to change your treatment, we will call you to review the results.  Testing/Procedures: Your physician has requested that you have an echocardiogram. Echocardiography is a painless test that uses sound waves to create images of your heart. It provides your doctor with information about the size and shape of your heart and how well your heart's chambers and valves are working. This procedure takes approximately one hour. There are no restrictions for this procedure.IN THE [place] OFFICE    Follow-Up: Your physician recommends that you schedule a follow-up appointment in: ONE YEAR WITH DR CRENSHAW IN [place] PLEASE GIVE OUR OFFICE A CALL 2 MONTHS PRIOR TO THAT APPOINTMENT TIME TO SCHEDULE

## 2018-01-22 ENCOUNTER — Encounter: Payer: Self-pay | Admitting: *Deleted

## 2018-03-05 ENCOUNTER — Ambulatory Visit (HOSPITAL_BASED_OUTPATIENT_CLINIC_OR_DEPARTMENT_OTHER)
Admission: RE | Admit: 2018-03-05 | Discharge: 2018-03-05 | Disposition: A | Discharge status: Home / Self Care | Payer: Self-pay | Source: Ambulatory Visit | Attending: Cardiology | Admitting: Cardiology

## 2018-03-05 DIAGNOSIS — I428 Other cardiomyopathies: Secondary | ICD-10-CM

## 2018-03-05 NOTE — Progress Notes (Signed)
  Echocardiogram 2D Echocardiogram has been performed.  Richard Vance T Taiga Lupinacci 03/05/2018, 9:43 AM

## 2018-08-15 ENCOUNTER — Other Ambulatory Visit: Payer: Self-pay

## 2018-08-15 MED ORDER — HYDRALAZINE HCL 100 MG PO TABS: 100.0000 mg | ORAL_TABLET | Freq: Three times a day (TID) | ORAL | 1 refills | Status: DC

## 2018-08-26 ENCOUNTER — Other Ambulatory Visit: Payer: Self-pay | Admitting: Cardiology

## 2018-08-26 ENCOUNTER — Telehealth: Payer: Self-pay | Admitting: Cardiology

## 2018-08-26 MED ORDER — METOPROLOL TARTRATE 75 MG PO TABS: 75.0000 mg | ORAL_TABLET | Freq: Two times a day (BID) | ORAL | 3 refills | Status: DC

## 2018-08-26 NOTE — Telephone Encounter (Signed)
Pt's pharmacy [facility] requesting a refill on metoprolol 50 mg tablet, pt taking 1.5 tablet BID. This was not sent in as requested from pt. Please address

## 2018-08-26 NOTE — Telephone Encounter (Signed)
Rx for Metoprolol 75 mg BID (which is equivalent to taking 1.5 tablets of 50 mg metoprolol BID) sent to requested pharmacy.

## 2018-09-04 ENCOUNTER — Other Ambulatory Visit: Payer: Self-pay | Admitting: Student

## 2019-01-29 ENCOUNTER — Other Ambulatory Visit: Payer: Self-pay

## 2019-01-29 ENCOUNTER — Encounter: Payer: Self-pay | Admitting: Family

## 2019-01-29 ENCOUNTER — Ambulatory Visit (INDEPENDENT_AMBULATORY_CARE_PROVIDER_SITE_OTHER): Payer: Self-pay | Admitting: Family

## 2019-01-29 VITALS — BP 104/82 | HR 79 | Ht 67.0 in | Wt 197.0 lb

## 2019-01-29 DIAGNOSIS — I5032 Chronic diastolic (congestive) heart failure: Secondary | ICD-10-CM

## 2019-01-29 DIAGNOSIS — I428 Other cardiomyopathies: Secondary | ICD-10-CM

## 2019-01-29 DIAGNOSIS — I10 Essential (primary) hypertension: Secondary | ICD-10-CM

## 2019-01-29 DIAGNOSIS — Z9189 Other specified personal risk factors, not elsewhere classified: Secondary | ICD-10-CM

## 2019-01-29 MED ORDER — SPIRONOLACTONE 25 MG PO TABS: ORAL_TABLET | ORAL | 3 refills | Status: DC

## 2019-01-29 MED ORDER — HYDRALAZINE HCL 100 MG PO TABS: 100.0000 mg | ORAL_TABLET | Freq: Three times a day (TID) | ORAL | 3 refills | Status: DC

## 2019-01-29 MED ORDER — FUROSEMIDE 20 MG PO TABS: 20.0000 mg | ORAL_TABLET | Freq: Every day | ORAL | 3 refills | Status: DC

## 2019-01-29 MED ORDER — LISINOPRIL 40 MG PO TABS: ORAL_TABLET | ORAL | 3 refills | Status: DC

## 2019-01-29 MED ORDER — METOPROLOL TARTRATE 75 MG PO TABS: 75.0000 mg | ORAL_TABLET | Freq: Two times a day (BID) | ORAL | 3 refills | Status: DC

## 2019-01-29 NOTE — Progress Notes (Signed)
Office Visit    Patient Name: Richard Vance Date of Encounter: 01/29/2019  Primary Care Provider:  Patient, No Pcp Per Primary Cardiologist:  Olga Millers, MD Electrophysiologist:  None   Chief Complaint    Richard Vance is a 53 y.o. male with a hx of HTN, chronic diastolic congestive heart failure, cardiomyopathy, VATS presents today for annual follow up of cardiomyopathy   Past Medical History    Past Medical History:  Diagnosis Date   Cardiomyopathy (HCC)    Dyskinesia dx'd 2000   Environmental allergies    GERD (gastroesophageal reflux disease)    Hypertension    IBS (irritable bowel syndrome)    Nonischemic cardiomyopathy (HCC)    a. EF 15-20% by echo in 2016 and improved to 55-60% by echo in 08/2015, felt to be secondary to HTN   Pneumonia <2005 X 1; 05/2014   Recurrent left pleural effusion    Type II diabetes mellitus (HCC)    Past Surgical History:  Procedure Laterality Date   LAPAROSCOPIC CHOLECYSTECTOMY  2005   LUNG DECORTICATION Left 05/2014    VIDEO ASSISTED THORACOSCOPY (VATS)/DECORTICATION  05/2014   "this time they used the camera"    Allergies  Allergies  Allergen Reactions   Dust Mite Extract     Runny nose Red eye   Other     Seat cushions per patient.    History of Present Illness    Richard Vance is a 53 y.o. male with a hx of  HTN, chronic diastolic congestive heart failure, cardiomyopathy, VATS last seen 01/21/18 by Dr. Jens Som.  Admitted 05/2014 to [facility] with exudative L pleural effusion secondary to PNA requiring VATS. Echo with EF 15-20% and moderately reduced RV function. Cardiac MRI with severely reduced LV function, no evidence MI, myocarditis or infiltrative process. RV function moderately to severely reduced. Cardiomyopathy felt to be nonischemic. Renal doppler 10/2014 no stenosis. Admitted 09/2014 with severe HTN and CHF. Echo 08/2015 and LV function normalized 55-60% and gr1DD. Echo 02/2018 with LVEF  50-55%, gr1DD, mild LVH, trace MR.  He reports feeling well. Has been staying at home helping his autistic daughter with her online learning. He is married with 3 children.  Tells me he walks for exercise with his daughter 30 minutes about 4 times per week. He endorses eating a low sodium, heart healthy diet. Tells me his hospitalization in 2016 really scared him and he has done his best to maintain a healthier lifestyle. Congratulated on his 6lb weight loss.   He reports no chest pain, pressure, tightness. Notes no SOB at rest. He has some DOE which is about the same as it was one year ago per his report. He reports no orthopnea, PND, edema, palpitations.   He does not have a primary care physician. He does not have health insurance so self-pay for all appointments/medications. Has had good luck with affordable medications through [facility].  EKGs/Labs/Other Studies Reviewed:   The following studies were reviewed today:  Echo 03/05/18 Study Conclusions   - Left ventricle: Wall thickness was increased in a pattern of mild   LVH. Systolic function was normal. The estimated ejection   fraction was in the range of 50% to 55%. Wall motion was normal;   there were no regional wall motion abnormalities. Doppler   parameters are consistent with abnormal left ventricular   relaxation (grade 1 diastolic dysfunction).   Impressions:   - Low normal EF.   Trace MR.   EKG:  EKG  is ordered today.  The ekg ordered today demonstrates SR rate 76 bpm with no acute ST/T wave changes.   Recent Labs: No results found for requested labs within last 8760 hours.  Recent Lipid Panel    Component Value Date/Time   CHOL 196 10/21/2014 0545   TRIG 154 (H) 10/21/2014 0545   HDL 25 (L) 10/21/2014 0545   CHOLHDL 7.8 10/21/2014 0545   VLDL 31 10/21/2014 0545   LDLCALC 140 (H) 10/21/2014 0545    Home Medications   Current Meds  Medication Sig   acetaminophen (TYLENOL) 500 MG tablet Take 500 mg by mouth  every 6 (six) hours as needed.   dicyclomine (BENTYL) 10 MG capsule Take 10 mg by mouth 2 (two) times daily.    famotidine (PEPCID) 20 MG tablet Take 20 mg by mouth daily.    furosemide (LASIX) 20 MG tablet Take 1 tablet (20 mg total) by mouth daily.   hydrALAZINE (APRESOLINE) 100 MG tablet Take 1 tablet (100 mg total) by mouth 3 (three) times daily.   ibuprofen (ADVIL) 200 MG tablet Take 200 mg by mouth every 8 (eight) hours as needed for moderate pain.   lisinopril (ZESTRIL) 40 MG tablet TAKE 1 TABLET(40 MG) BY MOUTH DAILY   Metoprolol Tartrate 75 MG TABS Take 75 mg by mouth 2 (two) times daily.   spironolactone (ALDACTONE) 25 MG tablet TAKE 1 TABLET BY MOUTH DAILY   [DISCONTINUED] furosemide (LASIX) 20 MG tablet TAKE 1 TABLET(20 MG) BY MOUTH DAILY   [DISCONTINUED] hydrALAZINE (APRESOLINE) 100 MG tablet Take 1 tablet (100 mg total) by mouth 3 (three) times daily. Please keep upcoming appt for further refills   [DISCONTINUED] lisinopril (PRINIVIL,ZESTRIL) 40 MG tablet TAKE 1 TABLET(40 MG) BY MOUTH DAILY   [DISCONTINUED] Metoprolol Tartrate 75 MG TABS Take 75 mg by mouth 2 (two) times daily.   [DISCONTINUED] spironolactone (ALDACTONE) 25 MG tablet TAKE 1 TABLET BY MOUTH DAILY      Review of Systems    Review of Systems  Constitution: Negative for chills, fever and malaise/fatigue.  Cardiovascular: Negative for chest pain,  irregular heartbeat, leg swelling, near-syncope and palpitations. Positive for dyspnea on exertion. Respiratory: Negative for cough, shortness of breath and wheezing.   Gastrointestinal: Negative for nausea and vomiting.  Neurological: Negative for dizziness, light-headedness and weakness.  All other systems reviewed and are otherwise negative except as noted above.  Physical Exam    VS:  BP 104/82 (BP Location: Left Arm, Patient Position: Sitting, Cuff Size: Normal)    Pulse 79    Ht 5\' 7"  (1.702 m)    Wt 197 lb (89.4 kg)    SpO2 99%    BMI 30.85 kg/m  ,  BMI Body mass index is 30.85 kg/m. GEN: Well nourished, well developed, in no acute distress. HEENT: normal. Neck: Supple, no JVD, carotid bruits, or masses. Cardiac: RRR, no murmurs, rubs, or gallops. No clubbing, cyanosis, edema.  Radials/DP/PT 2+ and equal bilaterally.  Respiratory:  Respirations regular and unlabored, clear to auscultation bilaterally. GI: Soft, nontender, nondistended, BS + x 4. MS: No deformity or atrophy. Skin: Warm and dry, no rash. Neuro:  Strength and sensation are intact. Psych: Normal affect.  Assessment & Plan    1. Cardiomyopathy - Initial dx 2016. Felt to by hypertensive. Echo 02/2018 EF 50-55%, gr1DD, trace MR, mild LVH. Continue GDMT Toprol, ACE inhibitor, Spironolactone, Furosemide, Hydralazine. BMET today.   2. Chronic diastolic congestive heart failure - Euvolemic on exam. Continue present dose of  diuretic. Continue low sodium diet. BMET today.  3. HTN - BP low normal today. Checks intermittently at home with readings 94/79 - 137/99. Average BP 120s/80s. He reports 1 episode of hypotension associated with lightheadedness when his BP was 94/79. Likely BP decreased over the last year due to his weight loss. Discussed option of decreasing Hydralazine dose to 50mg  TID. He prefers to continue to monitor his BP and will call our office if SBP consistently <100.   4. DOE - Stable compared to 1 year ago. Lung sounds clear on exam. CBC today. Etiology deconditioning vs diastolic CHF vs anemia.   5. Cardiovascular risk factor - Lipid profile today to assess cardiovascular risk.   Disposition: Follow up in 1 year(s) with Dr. Stanford Breed.   Loel Dubonnet, NP 01/29/2019, 5:13 PM

## 2019-01-29 NOTE — Patient Instructions (Addendum)
Medication Instructions:  No medication changes today.   *If you need a refill on your cardiac medications before your next appointment, please call your pharmacy*  Lab Work: Your physician recommends that you return for lab work today: BMET, lipid profile, CBC  If you have labs (blood work) drawn today and your tests are completely normal, you will receive your results only by: Marland Kitchen MyChart Message (if you have MyChart) OR . A paper copy in the mail If you have any lab test that is abnormal or we need to change your treatment, we will call you to review the results.  Testing/Procedures: You had an EKG today.   Follow-Up: At [facility], you and your health needs are our priority.  As part of our continuing mission to provide you with exceptional heart care, we have created designated Provider Care Teams.  These Care Teams include your primary Cardiologist (physician) and Advanced Practice Providers (APPs -  Physician Assistants and Nurse Practitioners) who all work together to provide you with the care you need, when you need it.  Your next appointment:   1 year(s)  The format for your next appointment:   In Person  Provider:   Kirk Ruths, MD  Other Instructions  It was a pleasure to meet you today! I will talk with Dr. Stanford Breed to see if he wants you to have a repeat echocardiogram. If he does want you to have one, I will call you to let you know. Because you are doing well I believe we could wait another year.   DASH Eating Plan DASH stands for "Dietary Approaches to Stop Hypertension." The DASH eating plan is a healthy eating plan that has been shown to reduce high blood pressure (hypertension). It may also reduce your risk for type 2 diabetes, heart disease, and stroke. The DASH eating plan may also help with weight loss. What are tips for following this plan?  General guidelines  Avoid eating more than 2,300 mg (milligrams) of salt (sodium) a day. If you have  hypertension, you may need to reduce your sodium intake to 1,500 mg a day.  Limit alcohol intake to no more than 1 drink a day for nonpregnant women and 2 drinks a day for men. One drink equals 12 oz of beer, 5 oz of wine, or 1 oz of hard liquor.  Work with your health care provider to maintain a healthy body weight or to lose weight. Ask what an ideal weight is for you.  Get at least 30 minutes of exercise that causes your heart to beat faster (aerobic exercise) most days of the week. Activities may include walking, swimming, or biking.  Work with your health care provider or diet and nutrition specialist (dietitian) to adjust your eating plan to your individual calorie needs. Reading food labels   Check food labels for the amount of sodium per serving. Choose foods with less than 5 percent of the Daily Value of sodium. Generally, foods with less than 300 mg of sodium per serving fit into this eating plan.  To find whole grains, look for the word "whole" as the first word in the ingredient list. Shopping  Buy products labeled as "low-sodium" or "no salt added."  Buy fresh foods. Avoid canned foods and premade or frozen meals. Cooking  Avoid adding salt when cooking. Use salt-free seasonings or herbs instead of table salt or sea salt. Check with your health care provider or pharmacist before using salt substitutes.  Do not fry foods.  Cook foods using healthy methods such as baking, boiling, grilling, and broiling instead.  Cook with heart-healthy oils, such as olive, canola, soybean, or sunflower oil. Meal planning  Eat a balanced diet that includes: ? 5 or more servings of fruits and vegetables each day. At each meal, try to fill half of your plate with fruits and vegetables. ? Up to 6-8 servings of whole grains each day. ? Less than 6 oz of lean meat, poultry, or fish each day. A 3-oz serving of meat is about the same size as a deck of cards. One egg equals 1 oz. ? 2 servings of  low-fat dairy each day. ? A serving of nuts, seeds, or beans 5 times each week. ? Heart-healthy fats. Healthy fats called Omega-3 fatty acids are found in foods such as flaxseeds and coldwater fish, like sardines, salmon, and mackerel.  Limit how much you eat of the following: ? Canned or prepackaged foods. ? Food that is high in trans fat, such as fried foods. ? Food that is high in saturated fat, such as fatty meat. ? Sweets, desserts, sugary drinks, and other foods with added sugar. ? Full-fat dairy products.  Do not salt foods before eating.  Try to eat at least 2 vegetarian meals each week.  Eat more home-cooked food and less restaurant, buffet, and fast food.  When eating at a restaurant, ask that your food be prepared with less salt or no salt, if possible. What foods are recommended? The items listed may not be a complete list. Talk with your dietitian about what dietary choices are best for you. Grains Whole-grain or whole-wheat bread. Whole-grain or whole-wheat pasta. Brown rice. Orpah Cobb. Bulgur. Whole-grain and low-sodium cereals. Pita bread. Low-fat, low-sodium crackers. Whole-wheat flour tortillas. Vegetables Fresh or frozen vegetables (raw, steamed, roasted, or grilled). Low-sodium or reduced-sodium tomato and vegetable juice. Low-sodium or reduced-sodium tomato sauce and tomato paste. Low-sodium or reduced-sodium canned vegetables. Fruits All fresh, dried, or frozen fruit. Canned fruit in natural juice (without added sugar). Meat and other protein foods Skinless chicken or [place]. Ground chicken or [place]. Pork with fat trimmed off. Fish and seafood. Egg whites. Dried beans, peas, or lentils. Unsalted nuts, nut butters, and seeds. Unsalted canned beans. Lean cuts of beef with fat trimmed off. Low-sodium, lean deli meat. Dairy Low-fat (1%) or fat-free (skim) milk. Fat-free, low-fat, or reduced-fat cheeses. Nonfat, low-sodium ricotta or cottage cheese. Low-fat or  nonfat yogurt. Low-fat, low-sodium cheese. Fats and oils Soft margarine without trans fats. Vegetable oil. Low-fat, reduced-fat, or light mayonnaise and salad dressings (reduced-sodium). Canola, safflower, olive, soybean, and sunflower oils. Avocado. Seasoning and other foods Herbs. Spices. Seasoning mixes without salt. Unsalted popcorn and pretzels. Fat-free sweets. What foods are not recommended? The items listed may not be a complete list. Talk with your dietitian about what dietary choices are best for you. Grains Baked goods made with fat, such as croissants, muffins, or some breads. Dry pasta or rice meal packs. Vegetables Creamed or fried vegetables. Vegetables in a cheese sauce. Regular canned vegetables (not low-sodium or reduced-sodium). Regular canned tomato sauce and paste (not low-sodium or reduced-sodium). Regular tomato and vegetable juice (not low-sodium or reduced-sodium). Rosita Fire. Olives. Fruits Canned fruit in a light or heavy syrup. Fried fruit. Fruit in cream or butter sauce. Meat and other protein foods Fatty cuts of meat. Ribs. Fried meat. Tomasa Blase. Sausage. Bologna and other processed lunch meats. Salami. Fatback. Hotdogs. Bratwurst. Salted nuts and seeds. Canned beans with added salt. Canned or  smoked fish. Whole eggs or egg yolks. Chicken or [place] with skin. Dairy Whole or 2% milk, cream, and half-and-half. Whole or full-fat cream cheese. Whole-fat or sweetened yogurt. Full-fat cheese. Nondairy creamers. Whipped toppings. Processed cheese and cheese spreads. Fats and oils Butter. Stick margarine. Lard. Shortening. Ghee. Bacon fat. Tropical oils, such as coconut, palm kernel, or palm oil. Seasoning and other foods Salted popcorn and pretzels. Onion salt, garlic salt, seasoned salt, table salt, and sea salt. Worcestershire sauce. Tartar sauce. Barbecue sauce. Teriyaki sauce. Soy sauce, including reduced-sodium. Steak sauce. Canned and packaged gravies. Fish sauce. Oyster  sauce. Cocktail sauce. Horseradish that you find on the shelf. Ketchup. Mustard. Meat flavorings and tenderizers. Bouillon cubes. Hot sauce and Tabasco sauce. Premade or packaged marinades. Premade or packaged taco seasonings. Relishes. Regular salad dressings. Where to find more information:  National Heart, Lung, and Blood Institute: PopSteam.is  American Heart Association: www.heart.org Summary  The DASH eating plan is a healthy eating plan that has been shown to reduce high blood pressure (hypertension). It may also reduce your risk for type 2 diabetes, heart disease, and stroke.  With the DASH eating plan, you should limit salt (sodium) intake to 2,300 mg a day. If you have hypertension, you may need to reduce your sodium intake to 1,500 mg a day.  When on the DASH eating plan, aim to eat more fresh fruits and vegetables, whole grains, lean proteins, low-fat dairy, and heart-healthy fats.  Work with your health care provider or diet and nutrition specialist (dietitian) to adjust your eating plan to your individual calorie needs. This information is not intended to replace advice given to you by your health care provider. Make sure you discuss any questions you have with your health care provider. Document Released: 01/25/2011 Document Revised: 01/18/2017 Document Reviewed: 01/30/2016 Elsevier Patient Education  2020 [employer].

## 2019-04-13 ENCOUNTER — Other Ambulatory Visit: Payer: Self-pay | Admitting: Cardiology

## 2020-01-29 ENCOUNTER — Other Ambulatory Visit: Payer: Self-pay | Admitting: Family

## 2020-01-29 NOTE — Telephone Encounter (Signed)
Refill sent in per request.  

## 2020-01-29 NOTE — Telephone Encounter (Signed)
Refill request

## 2020-01-31 ENCOUNTER — Telehealth: Payer: Self-pay | Admitting: Family

## 2020-02-02 NOTE — Telephone Encounter (Signed)
Please call pt to schedule overdue follow-up appointment for refills. Thank you!

## 2020-02-03 NOTE — Telephone Encounter (Signed)
Please see below.

## 2020-02-03 NOTE — Telephone Encounter (Signed)
Patient called with no answer but is a [place] [facility] patient

## 2020-02-16 ENCOUNTER — Telehealth: Payer: Self-pay | Admitting: Cardiology

## 2020-02-16 NOTE — Telephone Encounter (Signed)
Called [facility] to schedule overdue follow up due to Rx Request. Dr. Jens Som is booked through March, so Ahmaud is requesting to be worked in with a different provider for sooner. Please advise.

## 2020-03-01 ENCOUNTER — Other Ambulatory Visit: Payer: Self-pay | Admitting: Family

## 2020-03-01 NOTE — Telephone Encounter (Signed)
Patient is seen at [facility] office-please review for refill. Thank you!

## 2020-03-01 NOTE — Telephone Encounter (Signed)
Please review for refill, Thanks !  

## 2020-03-26 ENCOUNTER — Emergency Department (HOSPITAL_BASED_OUTPATIENT_CLINIC_OR_DEPARTMENT_OTHER): Payer: HRSA Program

## 2020-03-26 ENCOUNTER — Emergency Department (HOSPITAL_BASED_OUTPATIENT_CLINIC_OR_DEPARTMENT_OTHER)
Admission: EM | Admit: 2020-03-26 | Discharge: 2020-03-27 | Disposition: A | Discharge status: Home / Self Care | Payer: HRSA Program | Attending: Emergency Medicine | Admitting: Emergency Medicine

## 2020-03-26 ENCOUNTER — Encounter (HOSPITAL_BASED_OUTPATIENT_CLINIC_OR_DEPARTMENT_OTHER): Payer: Self-pay | Admitting: Emergency Medicine

## 2020-03-26 ENCOUNTER — Other Ambulatory Visit: Payer: Self-pay

## 2020-03-26 DIAGNOSIS — Z79899 Other long term (current) drug therapy: Secondary | ICD-10-CM | POA: Diagnosis not present

## 2020-03-26 DIAGNOSIS — U071 COVID-19: Secondary | ICD-10-CM | POA: Insufficient documentation

## 2020-03-26 DIAGNOSIS — E119 Type 2 diabetes mellitus without complications: Secondary | ICD-10-CM | POA: Insufficient documentation

## 2020-03-26 DIAGNOSIS — J988 Other specified respiratory disorders: Secondary | ICD-10-CM | POA: Diagnosis not present

## 2020-03-26 DIAGNOSIS — R059 Cough, unspecified: Secondary | ICD-10-CM | POA: Diagnosis present

## 2020-03-26 DIAGNOSIS — I110 Hypertensive heart disease with heart failure: Secondary | ICD-10-CM | POA: Diagnosis not present

## 2020-03-26 DIAGNOSIS — I5032 Chronic diastolic (congestive) heart failure: Secondary | ICD-10-CM | POA: Diagnosis not present

## 2020-03-26 LAB — CBC
HCT: 43.4 % (ref 39.0–52.0)
Hemoglobin: 15.3 g/dL (ref 13.0–17.0)
MCH: 29.4 pg (ref 26.0–34.0)
MCHC: 35.3 g/dL (ref 30.0–36.0)
MCV: 83.3 fL (ref 80.0–100.0)
Platelets: 166 K/uL (ref 150–400)
RBC: 5.21 MIL/uL (ref 4.22–5.81)
RDW: 12.2 % (ref 11.5–15.5)
WBC: 4.9 K/uL (ref 4.0–10.5)
nRBC: 0 % (ref 0.0–0.2)

## 2020-03-26 LAB — BASIC METABOLIC PANEL
Anion gap: 13 (ref 5–15)
BUN: 16 mg/dL (ref 6–20)
CO2: 23 mmol/L (ref 22–32)
Calcium: 9 mg/dL (ref 8.9–10.3)
Chloride: 97 mmol/L — ABNORMAL LOW (ref 98–111)
Creatinine, Ser: 1.12 mg/dL (ref 0.61–1.24)
GFR, Estimated: >60 mL/min (ref >60)
Glucose, Bld: 299 mg/dL — ABNORMAL HIGH (ref 70–99)
Potassium: 3.5 mmol/L (ref 3.5–5.1)
Sodium: 133 mmol/L — ABNORMAL LOW (ref 135–145)

## 2020-03-26 LAB — TROPONIN I (HIGH SENSITIVITY)
Troponin I (High Sensitivity): 12 ng/L (ref <18)
Troponin I (High Sensitivity): 10 ng/L (ref <18)

## 2020-03-26 LAB — CBG MONITORING, ED: Glucose-Capillary: 258 mg/dL — ABNORMAL HIGH (ref 70–99)

## 2020-03-26 MED ORDER — ONDANSETRON HCL 4 MG/2ML IJ SOLN
INTRAMUSCULAR | Status: AC
  Administered 2020-03-26: 4 mg via INTRAVENOUS
  Filled 2020-03-26: qty 2

## 2020-03-26 MED ORDER — DICYCLOMINE HCL 10 MG PO CAPS
10.0000 mg | ORAL_CAPSULE | Freq: Once | ORAL | Status: AC
  Administered 2020-03-26: 10 mg via ORAL
  Filled 2020-03-26: qty 1

## 2020-03-26 MED ORDER — DOXYCYCLINE HYCLATE 100 MG PO CAPS: 100.0000 mg | ORAL_CAPSULE | Freq: Two times a day (BID) | ORAL | 0 refills | Status: DC

## 2020-03-26 MED ORDER — SODIUM CHLORIDE 0.9 % IV BOLUS
1000.0000 mL | Freq: Once | INTRAVENOUS | Status: AC
  Administered 2020-03-26: 1000 mL via INTRAVENOUS

## 2020-03-26 MED ORDER — ONDANSETRON HCL 4 MG/2ML IJ SOLN: 4.0000 mg | Freq: Once | INTRAMUSCULAR | Status: AC

## 2020-03-26 MED ORDER — ONDANSETRON 4 MG PO TBDP: 4.0000 mg | ORAL_TABLET | Freq: Three times a day (TID) | ORAL | 0 refills | Status: DC | PRN

## 2020-03-26 NOTE — ED Triage Notes (Signed)
Reports cough with chest congestion that started on Monday.  Reports cp that started today.  Also reports decreased appetite with some nausea but no vomiting.

## 2020-03-26 NOTE — ED Notes (Signed)
Ekg given to Dr. Denton Lank

## 2020-03-26 NOTE — ED Provider Notes (Signed)
[facility] EMERGENCY DEPARTMENT Provider Note   CSN: [id number] Arrival date & time: 03/26/20  1918     History Chief Complaint  Patient presents with  . Chest Pain  . Cough    Richard Vance is a 55 y.o. male.  Patient with history of cardiomyopathy/heart failure (echo 2020 grade one diastolic dysfunction/normal EF), diabetes --presents to the emergency department today for upper respiratory tract symptoms.  Patient states that approximately 4 days ago he developed a cough with mildly elevated temperature to 99.9 F, congestion.  He has had decreased appetite and nausea but no vomiting.  He has been tested for Covid because family members have had similar symptoms, and reports negative test.  He has been using OTC meds.  No shortness of breath or lower extremity symptoms.  Chest tightness started today.  Patient actually feels as though he is dehydrated due to decreased oral intake.  The onset of this condition was acute. The course is constant. Aggravating factors: none. Alleviating factors: none.  No associated diaphoresis or radiation of chest pain.        Past Medical History:  Diagnosis Date  . Cardiomyopathy (HCC)   . Dyskinesia dx'd 2000  . Environmental allergies   . GERD (gastroesophageal reflux disease)   . Hypertension   . IBS (irritable bowel syndrome)   . Nonischemic cardiomyopathy (HCC)    a. EF 15-20% by echo in 2016 and improved to 55-60% by echo in 08/2015, felt to be secondary to HTN  . Pneumonia <2005 X 1; 05/2014  . Recurrent left pleural effusion   . Type II diabetes mellitus [facility])     Patient Active Problem List   Diagnosis Date Noted  . Chronic diastolic heart failure (HCC) 11/12/2016  . Hypertensive emergency 10/20/2014  . Hypertensive urgency 10/20/2014  . Congestive dilated cardiomyopathy (HCC) 09/01/2014  . Essential hypertension 09/01/2014    Past Surgical History:  Procedure Laterality Date  . LAPAROSCOPIC CHOLECYSTECTOMY  2005  .  LUNG DECORTICATION Left 05/2014   . VIDEO ASSISTED THORACOSCOPY (VATS)/DECORTICATION  05/2014   "this time they used the camera"       Family History  Problem Relation Age of Onset  . Diabetes Father   . Heart disease Father     Social History   Tobacco Use  . Smoking status: Never Smoker  . Smokeless tobacco: Never Used  Substance Use Topics  . Alcohol use: No  . Drug use: No    Home Medications Prior to Admission medications   Medication Sig Start Date End Date Taking? Authorizing Provider  acetaminophen (TYLENOL) 500 MG tablet Take 500 mg by mouth every 6 (six) hours as needed.    [provider]  dicyclomine (BENTYL) 10 MG capsule Take 10 mg by mouth 2 (two) times daily.     [provider]  famotidine (PEPCID) 20 MG tablet Take 20 mg by mouth daily.     [provider]  furosemide (LASIX) 20 MG tablet Take 1 tablet (20 mg total) by mouth daily. 01/29/19   Alver Sorrow, NP  hydrALAZINE (APRESOLINE) 100 MG tablet TAKE ONE TABLET BY MOUTH THREE TIMES A DAY 02/05/20   Alver Sorrow, NP  ibuprofen (ADVIL) 200 MG tablet Take 200 mg by mouth every 8 (eight) hours as needed for moderate pain.    [provider]  lisinopril (ZESTRIL) 40 MG tablet TAKE ONE TABLET BY MOUTH ONE TIME DAILY 01/29/20   Alver Sorrow, NP  Metoprolol Tartrate  75 MG TABS Take 75 mg by mouth 2 (two) times daily. 01/29/19   Alver Sorrow, NP  spironolactone (ALDACTONE) 25 MG tablet TAKE 1 TABLET BY MOUTH DAILY 01/29/19   Alver Sorrow, NP    Allergies    Dust mite extract and Other  Review of Systems   Review of Systems  Constitutional: Positive for appetite change and fatigue. Negative for chills and fever.  HENT: Positive for congestion. Negative for ear pain, rhinorrhea, sinus pressure and sore throat.   Eyes: Negative for redness.  Respiratory: Positive for cough, chest tightness and shortness of breath. Negative for wheezing.    Gastrointestinal: Positive for nausea. Negative for abdominal pain, diarrhea and vomiting.  Genitourinary: Negative for dysuria.  Musculoskeletal: Positive for myalgias. Negative for neck stiffness.  Skin: Negative for rash.  Neurological: Negative for headaches.  Hematological: Negative for adenopathy.    Physical Exam Updated Vital Signs BP 116/86   Pulse 86   Temp 98.8 F (37.1 C) (Oral)   Resp 15   Ht 5\' 7"  (1.702 m)   Wt 83 kg   SpO2 96%   BMI 28.66 kg/m   Physical Exam Vitals and nursing note reviewed.  Constitutional:      Appearance: He is well-developed and well-nourished.  HENT:     Head: Normocephalic and atraumatic.     Jaw: No trismus.     Right Ear: Tympanic membrane, ear canal and external ear normal.     Left Ear: Tympanic membrane, ear canal and external ear normal.     Nose: Nose normal. No mucosal edema or rhinorrhea.     Mouth/Throat:     Mouth: Oropharynx is clear and moist and mucous membranes are normal. Mucous membranes are not dry.     Pharynx: Uvula midline. No oropharyngeal exudate, posterior oropharyngeal edema, posterior oropharyngeal erythema or uvula swelling.     Tonsils: No tonsillar abscesses.  Eyes:     General:        Right eye: No discharge.        Left eye: No discharge.     Conjunctiva/sclera: Conjunctivae normal.  Cardiovascular:     Rate and Rhythm: Normal rate and regular rhythm.     Heart sounds: Normal heart sounds.  Pulmonary:     Effort: Pulmonary effort is normal. No respiratory distress.     Breath sounds: Normal breath sounds. No wheezing, rhonchi or rales.     Comments: Normal breath sounds without crackles or rhonchi Abdominal:     Palpations: Abdomen is soft.     Tenderness: There is no abdominal tenderness.  Musculoskeletal:     Cervical back: Normal range of motion and neck supple.     Right lower leg: No edema.     Left lower leg: No edema.  Skin:    General: Skin is warm and dry.  Neurological:      Mental Status: He is alert.  Psychiatric:        Mood and Affect: Mood and affect normal.     ED Results / Procedures / Treatments   Labs (all labs ordered are listed, but only abnormal results are displayed) Labs Reviewed  BASIC METABOLIC PANEL - Abnormal; Notable for the following components:      Result Value   Sodium 133 (*)    Chloride 97 (*)    Glucose, Bld 299 (*)    All other components within normal limits  SARS CORONAVIRUS 2 (TAT 6-24 HRS)  CBC  CBG  MONITORING, ED  TROPONIN I (HIGH SENSITIVITY)  TROPONIN I (HIGH SENSITIVITY)    EKG EKG Interpretation  Date/Time:  Saturday March 26 2020 19:28:04 EST Ventricular Rate:  88 PR Interval:  158 QRS Duration: 94 QT Interval:  388 QTC Calculation: 469 R Axis:   -47 Text Interpretation: Normal sinus rhythm Left axis deviation Confirmed by Cathren Laine ([id number]) on 03/26/2020 8:51:32 PM   Radiology DG Chest 2 View  Result Date: 03/26/2020 CLINICAL DATA:  Cough EXAM: CHEST - 2 VIEW COMPARISON:  None. FINDINGS: The cardiomediastinal silhouette is mildly enlarged in contour. No pleural effusion. No pneumothorax. Faint bilateral reticulonodular opacities. Visualized abdomen is unremarkable. No acute osseous abnormality noted. IMPRESSION: Faint bilateral reticulonodular opacities, nonspecific. This could reflect atypical infection or atelectasis. Electronically Signed   By: Meda Klinefelter MD   On: 03/26/2020 20:30    Procedures Procedures   Medications Ordered in ED Medications  dicyclomine (BENTYL) capsule 10 mg (has no administration in time range)  ondansetron (ZOFRAN) injection 4 mg (4 mg Intravenous Given 03/26/20 2121)  sodium chloride 0.9 % bolus 1,000 mL (0 mLs Intravenous Stopped 03/26/20 2229)    ED Course  I have reviewed the triage vital signs and the nursing notes.  Pertinent labs & imaging results that were available during my care of the patient were reviewed by me and considered in my medical decision  making (see chart for details).  Patient seen and examined. Work-up reviewed.  Patient looks a bit dry on exam.  Will give fluid bolus.  He does not appear fluid overloaded from a heart failure standpoint.  Covid testing sent.  He will be prescribed prescription for doxycycline to take if his Covid is negative.  Discussed that this will likely come back tomorrow.  Otherwise vital signs are reassuring.  Working to attempt better symptom control, and anticipate discharge to home if improved.  Vital signs reviewed and are as follows: BP 124/90   Pulse 88   Temp 98.8 F (37.1 C) (Oral)   Resp 20   Ht 5\' 7"  (1.702 m)   Wt 83 kg   SpO2 95%   BMI 28.66 kg/m   11:33 PM patient continuing to do well.  Plan for discharge home.  Treated in ED with IV fluids and Zofran.  Nausea improved and patient is tolerating p.o. fluids.  Current plan, await Covid test.  Doxycycline if negative to cover for community-acquired pneumonia.  Otherwise, will need follow-up with PCP given abnormalities on chest x-ray.  Do not feel that he requires advanced imaging tonight.  Low concern for PE, ACS.  Second troponin returned unchanged.  Patient urged to return with worsening symptoms or other concerns. Patient verbalized understanding and agrees with plan.   Richard Vance was evaluated in Emergency Department on 03/26/2020 for the symptoms described in the history of present illness. He was evaluated in the context of the global COVID-19 pandemic, which necessitated consideration that the patient might be at risk for infection with the SARS-CoV-2 virus that causes COVID-19. Institutional protocols and algorithms that pertain to the evaluation of patients at risk for COVID-19 are in a state of rapid change based on information released by regulatory bodies including the CDC and federal and state organizations. These policies and algorithms were followed during the patient's care in the ED.     Clinical Course as of 03/26/20  2134  Sat Mar 26, 2020  2055 Troponin I (High Sensitivity): 12 [KC]  2055 Troponin I (High Sensitivity) [KC]  2056 Troponin I (High Sensitivity): 12 [KC]  2056 Troponin I (High Sensitivity) [KC]  2056 Troponin I (High Sensitivity): 12 [KC]  2056 Glucose(!): 299 [KC]  2056 RBC: 5.21 [KC]  2057 Troponin I (High Sensitivity) [KC]  2115 SARS CORONAVIRUS 2 (TAT 6-24 HRS) Nasopharyngeal Nasopharyngeal Swab [KC]  2115 Glucose(!): 299 [KC]    Clinical Course User Index [KC] Corless, Rozann Lesches, Student-PA   MDM Rules/Calculators/A&P                          Patient presents the emergency department for evaluation of respiratory symptoms.  He appears well, nontoxic.  He is not in any respiratory distress.  He has had URI symptoms and cough.  Chest x-ray with faint bilateral reticular opacities.  Patient has had 3 recent Covid test which were negative.  He is not vaccinated.  From a cardiac standpoint, he does not appear to be fluid overloaded I do not suspect congestive heart failure exacerbation.  In fact he looks mildly dehydrated on exam.  He was treated with IV fluids and Zofran.  Troponin negative x2.  EKG without concerning ischemic changes.  Symptoms controlled in the emergency department plan as above.   Final Clinical Impression(s) / ED Diagnoses Final diagnoses:  Respiratory infection    Rx / DC Orders ED Discharge Orders         Ordered    doxycycline (VIBRAMYCIN) 100 MG capsule  2 times daily        03/26/20 2315    ondansetron (ZOFRAN ODT) 4 MG disintegrating tablet  Every 8 hours PRN        03/26/20 2318           Renne Crigler, PA-C 03/26/20 2336    Cathren Laine, MD 03/26/20 2337

## 2020-03-26 NOTE — Discharge Instructions (Signed)
Please read and follow all provided instructions.  Your diagnoses today include:  1. Respiratory infection     Tests performed today include:  Vital signs. See below for your results today.   Chest x-ray - shows some hazy areas which may be related to an infection Blood cell counts (white, red, and platelets) Electrolytes  Kidney function test Cardiac enzymes - normal x 2  Medications prescribed:   Doxycycline - antibiotic  You have been prescribed an antibiotic medicine: take the entire course of medicine even if you are feeling better. Stopping early can cause the antibiotic not to work.  Fill this medication tomorrow if your Covid-19 test is negative.    Zofran (ondansetron) - for nausea and vomiting  Take any prescribed medications only as directed. Treatment for your infection is aimed at treating the symptoms. There are no medications, such as antibiotics, that will cure your infection.   Home care instructions:  Follow any educational materials contained in this packet.   Your illness is contagious and can be spread to others, especially during the first 3 or 4 days. It cannot be cured by antibiotics or other medicines. Take basic precautions such as washing your hands often, covering your mouth when you cough or sneeze, and avoiding public places where you could spread your illness to others.   Please continue drinking plenty of fluids.  Use over-the-counter medicines as needed as directed on packaging for symptom relief.  You may also use ibuprofen or tylenol as directed on packaging for pain or fever.  Do not take multiple medicines containing Tylenol or acetaminophen to avoid taking too much of this medication.  Follow-up instructions: Please follow-up with your primary care provider in the next 3 days for further evaluation of your symptoms if you are not feeling better.   Return instructions:   Please return to the Emergency Department if you experience worsening  symptoms.   RETURN IMMEDIATELY IF you develop shortness of breath, confusion or altered mental status, a new rash, become dizzy, faint, or poorly responsive, or are unable to be cared for at home.  Please return if you have persistent vomiting and cannot keep down fluids or develop a fever that is not controlled by tylenol or motrin.    Please return if you have any other emergent concerns.  Additional Information:  Your vital signs today were: BP 103/86    Pulse 87    Temp 98.8 F (37.1 C) (Oral)    Resp 14    Ht 5\' 7"  (1.702 m)    Wt 83 kg    SpO2 97%    BMI 28.66 kg/m  If your blood pressure (BP) was elevated above 135/85 this visit, please have this repeated by your doctor within one month. --------------

## 2020-03-27 LAB — SARS CORONAVIRUS 2 (TAT 6-24 HRS): SARS Coronavirus 2: POSITIVE — AB

## 2020-03-28 ENCOUNTER — Telehealth: Payer: Self-pay | Admitting: Nurse Practitioner

## 2020-03-28 NOTE — Telephone Encounter (Signed)
SECOND ATTEMPT made to contact pt and no answer. VM is full.   Cline Crock PA-C  [facility]

## 2020-03-28 NOTE — Telephone Encounter (Signed)
Called to Discuss with patient about Covid symptoms and the use of the monoclonal antibody/antiviral infusion/oral therapies for those with mild to moderate Covid symptoms and at a high risk of hospitalization.     Pt appears to qualify for treatment due to co-morbid conditions and/or a member of an at-risk group in accordance with the FDA Emergency Use Authorization.   Unable to reach pt Voicemail states full. My Chart message sent.   Symptom onset: 03/25/3031 Vaccinated: Unsure Booster: Unsure Immunocompromised:No  Qualifiers: BMI, CAD, DM  Willette Alma, NP [employer]  [phone]

## 2020-03-29 NOTE — Telephone Encounter (Signed)
Called patient. No answer and VM box full. Will send another MyChart message.   Alver Sorrow, NP

## 2020-03-29 NOTE — Telephone Encounter (Signed)
Called to discuss with patient about COVID-19 symptoms and the use of one of the available treatments for those with mild to moderate Covid symptoms and at a high risk of hospitalization.  Pt appears to qualify for outpatient treatment due to co-morbid conditions and/or a member of an at-risk group in accordance with the FDA Emergency Use Authorization.    Symptom onset: 03/22/19 Vaccinated: No Booster? No  Richard Vance is outside of window for monoclonal antibodies as he is >7 days since symptom onset.   Reports cough and some congestion in his head as well as chest. Some relief with Coricidin last night though has not been taking this regularly. Tells me his "stomach felt awful" even with Zofran. Felt somewhat better this morning but then had diarrhea which depleted his energy.   Will send MyChart message with recommendations regarding symptom management and information for post-COVID clinic.   Alver Sorrow

## 2020-03-31 ENCOUNTER — Telehealth (INDEPENDENT_AMBULATORY_CARE_PROVIDER_SITE_OTHER): Payer: Self-pay | Admitting: Nurse Practitioner

## 2020-03-31 ENCOUNTER — Other Ambulatory Visit: Payer: Self-pay | Admitting: Family

## 2020-03-31 DIAGNOSIS — Z8616 Personal history of COVID-19: Secondary | ICD-10-CM

## 2020-03-31 MED ORDER — PREDNISONE 20 MG PO TABS: 20.0000 mg | ORAL_TABLET | Freq: Every day | ORAL | 0 refills | Status: AC

## 2020-03-31 MED ORDER — ONDANSETRON 4 MG PO TBDP: 4.0000 mg | ORAL_TABLET | Freq: Three times a day (TID) | ORAL | 0 refills | Status: DC | PRN

## 2020-03-31 NOTE — Progress Notes (Signed)
  Virtual Visit via Telephone Note  I connected with Richard Vance on 03/31/20 at  3:30 PM EST by telephone and verified that I am speaking with the correct person using two identifiers.  Location: Patient: home Provider: office   I discussed the limitations, risks, security and privacy concerns of performing an evaluation and management service by telephone and the availability of in person appointments. I also discussed with the patient that there may be a patient responsible charge related to this service. The patient expressed understanding and agreed to proceed.   History of Present Illness:  Patient presents today for post COVID care clinic visit.  Patient was seen in the ED on 03/26/2016.  He was diagnosed with Covid.  He was ordered doxycycline and Zofran.  Chest x-ray on the fifth did show Covid pneumonia.  Patient states that he is still having fatigue.  He does have decreased appetite.  He has been trying to stay well-hydrated.  We discussed the importance of trying to eat 6 small meals/snacks per day to keep blood sugar stable.  We also discussed the importance of staying well-hydrated. Denies f/c/s, n/v/d, hemoptysis, PND, chest pain or edema.     Observations/Objective:  Vitals with BMI 03/26/2020 03/26/2020 03/26/2020  Height - - -  Weight - - -  BMI - - -  Systolic 103 128 696  Diastolic 86 92 90  Pulse 87 87 88      Assessment and Plan:  Covid pneumonia Fatigue:   Stay well hydrated  Stay active  Deep breathing exercises  May take tylenol or fever or pain  May take mucinex twice daily  Will order prednisone  Eat six small meals per day    Follow Up Instructions:  Follow up in 2 weeks or sooner if needed    I discussed the assessment and treatment plan with the patient. The patient was provided an opportunity to ask questions and all were answered. The patient agreed with the plan and demonstrated an understanding of the instructions.   The patient was  advised to call back or seek an in-person evaluation if the symptoms worsen or if the condition fails to improve as anticipated.  I provided 22 minutes of non-face-to-face time during this encounter.   Ivonne Andrew, NP

## 2020-03-31 NOTE — Patient Instructions (Addendum)
Covid pneumonia Fatigue:   Stay well hydrated  Stay active  Deep breathing exercises  May take tylenol or fever or pain  May take mucinex twice daily  Will order prednisone  Eat six small meals per day      Follow up:  Follow up in 2 weeks or sooner if needed

## 2020-03-31 NOTE — Addendum Note (Signed)
Addended by: Alver Sorrow on: 03/31/2020 08:53 AM   Modules accepted: Orders

## 2020-04-06 ENCOUNTER — Telehealth: Payer: Self-pay | Admitting: Nurse Practitioner

## 2020-04-06 NOTE — Telephone Encounter (Signed)
Done

## 2020-04-07 NOTE — Telephone Encounter (Signed)
  Attempted to contact patient to schedule appt but got no answer and his voicemail was full. I will mail patient a recall letter reminding him to contact our office to schedule an appointment.

## 2020-04-12 DIAGNOSIS — Z8616 Personal history of COVID-19: Secondary | ICD-10-CM | POA: Insufficient documentation

## 2020-04-14 ENCOUNTER — Ambulatory Visit (INDEPENDENT_AMBULATORY_CARE_PROVIDER_SITE_OTHER): Payer: Self-pay | Admitting: Nurse Practitioner

## 2020-04-14 ENCOUNTER — Ambulatory Visit
Admission: RE | Admit: 2020-04-14 | Discharge: 2020-04-14 | Disposition: A | Discharge status: Home / Self Care | Payer: No Typology Code available for payment source | Source: Ambulatory Visit | Attending: Nurse Practitioner | Admitting: Nurse Practitioner

## 2020-04-14 VITALS — BP 150/110 | HR 90 | Temp 97.1°F | Resp 16 | Wt 190.1 lb

## 2020-04-14 DIAGNOSIS — Z8616 Personal history of COVID-19: Secondary | ICD-10-CM

## 2020-04-14 MED ORDER — DOCUSATE SODIUM 100 MG PO CAPS: 100.0000 mg | ORAL_CAPSULE | Freq: Two times a day (BID) | ORAL | 0 refills | Status: DC

## 2020-04-14 NOTE — Assessment & Plan Note (Signed)
Glad you are improving!  Stay well hydrated  Stay active  Deep breathing exercises  May start vitamin C daily, vitamin D3 daily, Zinc daily  May take tylenol for fever or pain   Will order chest x ray:  [facility] [address] [place], [place] [address] [phone] MON - FRI 8:00 AM - 4:00 PM - WALK IN   Follow up:  Follow up in 2 weeks or sooner if needed

## 2020-04-14 NOTE — Progress Notes (Signed)
@Patient  ID: , male    DOB: 11/29/1965, 55 y.o.   MRN: [id number]  Chief Complaint  Patient presents with  . Follow-up    Mild nonproductive cough    Referring provider: No ref. provider found    HPI  Patient presents today for post COVID care clinic visit / follow up.  Patient was last seen in our office on 03/31/2020.  Patient was prescribed prednisone.  Patient states that overall he is much improved.  His appetite has improved and he is staying well-hydrated.  His blood pressure is elevated during visit today but he states that he did forget to take his blood pressure medications this morning. Denies f/c/s, n/v/d, hemoptysis, PND, chest pain or edema.      Allergies  Allergen Reactions  . Dust Mite Extract     Runny nose Red eye  . Other     Seat cushions per patient.     There is no immunization history on file for this patient.  Past Medical History:  Diagnosis Date  . Cardiomyopathy (HCC)   . Dyskinesia dx'd 2000  . Environmental allergies   . GERD (gastroesophageal reflux disease)   . Hypertension   . IBS (irritable bowel syndrome)   . Nonischemic cardiomyopathy (HCC)    a. EF 15-20% by echo in 2016 and improved to 55-60% by echo in 08/2015, felt to be secondary to HTN  . Pneumonia <2005 X 1; 05/2014  . Recurrent left pleural effusion   . Type II diabetes mellitus (HCC)     Tobacco History: Social History   Tobacco Use  Smoking Status Never Smoker  Smokeless Tobacco Never Used   Counseling given: Not Answered   Outpatient Encounter Medications as of 04/14/2020  Medication Sig  . docusate sodium (COLACE) 100 MG capsule Take 1 capsule (100 mg total) by mouth 2 (two) times daily.  04/16/2020 acetaminophen (TYLENOL) 500 MG tablet Take 500 mg by mouth every 6 (six) hours as needed.  . dicyclomine (BENTYL) 10 MG capsule Take 10 mg by mouth 2 (two) times daily.   . famotidine (PEPCID) 20 MG tablet Take 20 mg by mouth daily.   . furosemide (LASIX) 20  MG tablet Take 1 tablet (20 mg total) by mouth daily.  . hydrALAZINE (APRESOLINE) 100 MG tablet TAKE ONE TABLET BY MOUTH THREE TIMES A DAY  . ibuprofen (ADVIL) 200 MG tablet Take 200 mg by mouth every 8 (eight) hours as needed for moderate pain.  Marland Kitchen lisinopril (ZESTRIL) 40 MG tablet TAKE ONE TABLET BY MOUTH ONE TIME DAILY  . Metoprolol Tartrate 75 MG TABS Take 75 mg by mouth 2 (two) times daily.  . ondansetron (ZOFRAN ODT) 4 MG disintegrating tablet Take 1 tablet (4 mg total) by mouth every 8 (eight) hours as needed for nausea or vomiting.  Marland Kitchen spironolactone (ALDACTONE) 25 MG tablet TAKE 1 TABLET BY MOUTH DAILY   No facility-administered encounter medications on file as of 04/14/2020.     Review of Systems  Review of Systems  Constitutional: Negative.  Negative for fatigue and fever.  HENT: Negative.   Respiratory: Negative for cough and shortness of breath.   Cardiovascular: Negative.  Negative for chest pain, palpitations and leg swelling.  Gastrointestinal: Negative.   Allergic/Immunologic: Negative.   Neurological: Negative.   Psychiatric/Behavioral: Negative.        Physical Exam  BP (!) 150/110 (BP Location: Left Arm, Patient Position: Sitting)   Pulse 90   Temp (!) 97.1 F (36.2 C) (  Skin)   Resp 16   Wt 190 lb 1.9 oz (86.2 kg)   SpO2 100%   BMI 29.78 kg/m   Wt Readings from Last 5 Encounters:  04/14/20 190 lb 1.9 oz (86.2 kg)  03/26/20 183 lb (83 kg)  01/29/19 197 lb (89.4 kg)  01/21/18 203 lb (92.1 kg)  11/12/16 201 lb 12.8 oz (91.5 kg)     Physical Exam Vitals and nursing note reviewed.  Constitutional:      General: He is not in acute distress.    Appearance: He is well-developed and well-nourished.  Cardiovascular:     Rate and Rhythm: Normal rate and regular rhythm.  Pulmonary:     Effort: Pulmonary effort is normal.     Breath sounds: Normal breath sounds.  Musculoskeletal:     Right lower leg: No edema.     Left lower leg: No edema.  Skin:     General: Skin is warm and dry.  Neurological:     Mental Status: He is alert and oriented to person, place, and time.  Psychiatric:        Mood and Affect: Mood and affect and mood normal.        Behavior: Behavior normal.       Imaging: DG Chest 2 View  Result Date: 03/26/2020 CLINICAL DATA:  Cough EXAM: CHEST - 2 VIEW COMPARISON:  None. FINDINGS: The cardiomediastinal silhouette is mildly enlarged in contour. No pleural effusion. No pneumothorax. Faint bilateral reticulonodular opacities. Visualized abdomen is unremarkable. No acute osseous abnormality noted. IMPRESSION: Faint bilateral reticulonodular opacities, nonspecific. This could reflect atypical infection or atelectasis. Electronically Signed   By: Meda Klinefelter MD   On: 03/26/2020 20:30     Assessment & Plan:   History of COVID-19 Glad you are improving!  Stay well hydrated  Stay active  Deep breathing exercises  May start vitamin C daily, vitamin D3 daily, Zinc daily  May take tylenol for fever or pain   Will order chest x ray:  [facility] [address] [place], [place] [address] [phone] MON - FRI 8:00 AM - 4:00 PM - WALK IN   Follow up:  Follow up in 2 weeks or sooner if needed      Ivonne Andrew, NP 04/14/2020

## 2020-04-14 NOTE — Patient Instructions (Signed)
Covid 19:  Glad you are improving!  Stay well hydrated  Stay active  Deep breathing exercises  May start vitamin C daily, vitamin D3 daily, Zinc daily  May take tylenol for fever or pain   Will order chest x ray:  [facility] [address] [place], [place] [address] [phone] MON - FRI 8:00 AM - 4:00 PM - WALK IN   Follow up:  Follow up in 2 weeks or sooner if needed

## 2020-04-18 ENCOUNTER — Other Ambulatory Visit: Payer: Self-pay | Admitting: Nurse Practitioner

## 2020-04-18 MED ORDER — PREDNISONE 20 MG PO TABS: 20.0000 mg | ORAL_TABLET | Freq: Every day | ORAL | 0 refills | Status: AC

## 2020-04-26 ENCOUNTER — Telehealth: Payer: Self-pay | Admitting: Cardiology

## 2020-04-26 NOTE — Telephone Encounter (Signed)
*  STAT* If patient is at the pharmacy, call can be transferred to refill team.   1. Which medications need to be refilled? (please list name of each medication and dose if known)  Metoprolol  2. Which pharmacy/location (including street and city if local pharmacy) is medication to be sent to? [facility]  3. Do they need a 30 day or 90 day supply?  enough until appt on 06-15-20 please call today of possible

## 2020-04-28 ENCOUNTER — Other Ambulatory Visit: Payer: Self-pay | Admitting: Family

## 2020-04-30 ENCOUNTER — Other Ambulatory Visit: Payer: Self-pay | Admitting: Family

## 2020-05-02 NOTE — Telephone Encounter (Signed)
Refill request

## 2020-05-16 ENCOUNTER — Ambulatory Visit
Admission: RE | Admit: 2020-05-16 | Discharge: 2020-05-16 | Disposition: A | Discharge status: Home / Self Care | Payer: No Typology Code available for payment source | Source: Ambulatory Visit | Attending: Nurse Practitioner | Admitting: Nurse Practitioner

## 2020-05-16 ENCOUNTER — Ambulatory Visit (INDEPENDENT_AMBULATORY_CARE_PROVIDER_SITE_OTHER): Payer: Self-pay | Admitting: Nurse Practitioner

## 2020-05-16 ENCOUNTER — Other Ambulatory Visit: Payer: Self-pay

## 2020-05-16 VITALS — BP 121/78 | HR 86 | Temp 97.9°F | Resp 18

## 2020-05-16 DIAGNOSIS — Z8616 Personal history of COVID-19: Secondary | ICD-10-CM

## 2020-05-16 NOTE — Patient Instructions (Signed)
History of Covid 19:   Stay well hydrated  Stay active  Deep breathing exercises  May start vitamin C daily, vitamin D3 daily, Zinc daily  May take tylenol or fever or pain  May take mucinex DM twice daily  Will order chest x ray:  [facility] [address] [place], [place] [address] [phone] MON - FRI 8:00 AM - 4:00 PM - WALK IN   Follow up:  Follow up if needed

## 2020-05-16 NOTE — Assessment & Plan Note (Signed)
Stay well hydrated  Stay active  Deep breathing exercises  May start vitamin C daily, vitamin D3 daily, Zinc daily  May take tylenol or fever or pain  May take mucinex DM twice daily  Will order chest x ray:  [facility] [address] [place], [place] [address] [phone] MON - FRI 8:00 AM - 4:00 PM - WALK IN   Follow up:  Follow up if needed

## 2020-05-16 NOTE — Progress Notes (Signed)
@Patient  ID: Richard Vance, male    DOB: 11/22/1965, 55 y.o.   MRN: [id number]  Chief Complaint  Patient presents with  . Follow-up    Doing much better    Referring provider: No ref. provider found  HPI  Patient presents today for post COVID care clinic visit follow-up.  Patient was last seen in our office on 04/15/2020.  His chest x-ray at that visit showed pneumonia with trace pleural fluid on the left side.  Patient states that he has finished the course of prednisone that was prescribed to him.  He is staying active.  He states that he is back to his baseline.  No new issues or concerns. Denies f/c/s, n/v/d, hemoptysis, PND, chest pain or edema.      Allergies  Allergen Reactions  . Dust Mite Extract     Runny nose Red eye  . Other     Seat cushions per patient.     There is no immunization history on file for this patient.  Past Medical History:  Diagnosis Date  . Cardiomyopathy (HCC)   . Dyskinesia dx'd 2000  . Environmental allergies   . GERD (gastroesophageal reflux disease)   . Hypertension   . IBS (irritable bowel syndrome)   . Nonischemic cardiomyopathy (HCC)    a. EF 15-20% by echo in 2016 and improved to 55-60% by echo in 08/2015, felt to be secondary to HTN  . Pneumonia <2005 X 1; 05/2014  . Recurrent left pleural effusion   . Type II diabetes mellitus (HCC)     Tobacco History: Social History   Tobacco Use  Smoking Status Never Smoker  Smokeless Tobacco Never Used   Counseling given: Yes   Outpatient Encounter Medications as of 05/16/2020  Medication Sig  . acetaminophen (TYLENOL) 500 MG tablet Take 500 mg by mouth every 6 (six) hours as needed.  . dicyclomine (BENTYL) 10 MG capsule Take 10 mg by mouth 2 (two) times daily.   05/18/2020 docusate sodium (COLACE) 100 MG capsule Take 1 capsule (100 mg total) by mouth 2 (two) times daily.  . famotidine (PEPCID) 20 MG tablet Take 20 mg by mouth daily.   . furosemide (LASIX) 20 MG tablet Take 1 tablet (20  mg total) by mouth daily.  . hydrALAZINE (APRESOLINE) 100 MG tablet TAKE ONE TABLET BY MOUTH THREE TIMES A DAY  . ibuprofen (ADVIL) 200 MG tablet Take 200 mg by mouth every 8 (eight) hours as needed for moderate pain.  Marland Kitchen lisinopril (ZESTRIL) 40 MG tablet TAKE ONE TABLET BY MOUTH ONE TIME DAILY  . metoprolol tartrate (LOPRESSOR) 50 MG tablet TAKE ONE AND ONE-HALF TABLETS BY MOUTH TWICE A DAY. APPT REQUIRED FOR FUTURE REFILLS.  . Metoprolol Tartrate 75 MG TABS Take 75 mg by mouth 2 (two) times daily.  . ondansetron (ZOFRAN ODT) 4 MG disintegrating tablet Take 1 tablet (4 mg total) by mouth every 8 (eight) hours as needed for nausea or vomiting.  Marland Kitchen spironolactone (ALDACTONE) 25 MG tablet TAKE 1 TABLET BY MOUTH DAILY   No facility-administered encounter medications on file as of 05/16/2020.     Review of Systems  Review of Systems  Constitutional: Negative.  Negative for fatigue and fever.  HENT: Negative.   Respiratory: Negative for cough and shortness of breath.   Cardiovascular: Negative.  Negative for chest pain, palpitations and leg swelling.  Gastrointestinal: Negative.   Allergic/Immunologic: Negative.   Neurological: Negative.   Psychiatric/Behavioral: Negative.        Physical  Exam  BP 121/78   Pulse 86   Temp 97.9 F (36.6 C)   Resp 18   SpO2 99% Comment: RA  Wt Readings from Last 5 Encounters:  04/14/20 190 lb 1.9 oz (86.2 kg)  03/26/20 183 lb (83 kg)  01/29/19 197 lb (89.4 kg)  01/21/18 203 lb (92.1 kg)  11/12/16 201 lb 12.8 oz (91.5 kg)     Physical Exam Vitals and nursing note reviewed.  Constitutional:      General: He is not in acute distress.    Appearance: He is well-developed.  Cardiovascular:     Rate and Rhythm: Normal rate and regular rhythm.  Pulmonary:     Effort: Pulmonary effort is normal.     Breath sounds: Normal breath sounds.  Musculoskeletal:     Right lower leg: No edema.     Left lower leg: No edema.  Skin:    General: Skin is  warm and dry.  Neurological:     Mental Status: He is alert and oriented to person, place, and time.  Psychiatric:        Mood and Affect: Mood normal.        Behavior: Behavior normal.        Assessment & Plan:   History of COVID-19 Stay well hydrated  Stay active  Deep breathing exercises  May start vitamin C daily, vitamin D3 daily, Zinc daily  May take tylenol or fever or pain  May take mucinex DM twice daily  Will order chest x ray:  [facility] [address] [place], [place] [address] [phone] MON - FRI 8:00 AM - 4:00 PM - WALK IN   Follow up:  Follow up if needed      Ivonne Andrew, NP 05/16/2020

## 2020-05-22 ENCOUNTER — Other Ambulatory Visit: Payer: Self-pay | Admitting: Family

## 2020-06-07 NOTE — Progress Notes (Signed)
HPI: FU cardiomyopathy. Patient was admitted in April 2016 to [facility] in [place] with exudative left pleural effusion secondary to pneumonia requiring VATS. Echocardiogram showed an ejection fraction of 15-20% and moderately reduced right ventricular function. Cardiac MRI showed severely reduced LV function with no evidence of myocardial infarction, myocarditis or infiltrative process. Right ventricular function moderate to severely reduced. Cardiomyopathy felt nonischemic. Renal Doppler September 2016 showed no stenosis. Echocardiogram repeated January 2020 showed ejection fraction 50 to 55%, mild left ventricular hypertrophy and grade 1 diastolic dysfunction. Since last seen,he occasionally has mild dyspnea on exertion but no exertional chest pain.  No orthopnea, PND or pedal edema.  No syncope.  He has not taken his Lasix and this has not caused increased dyspnea or edema.  Current Outpatient Medications  Medication Sig Dispense Refill  . acetaminophen (TYLENOL) 500 MG tablet Take 500 mg by mouth every 6 (six) hours as needed.    . dicyclomine (BENTYL) 10 MG capsule Take 10 mg by mouth 2 (two) times daily.     . famotidine (PEPCID) 20 MG tablet Take 20 mg by mouth daily.     . hydrALAZINE (APRESOLINE) 100 MG tablet TAKE ONE TABLET BY MOUTH THREE TIMES A DAY 90 tablet 3  . ibuprofen (ADVIL) 200 MG tablet Take 200 mg by mouth every 8 (eight) hours as needed for moderate pain.    Marland Kitchen lisinopril (ZESTRIL) 40 MG tablet TAKE ONE TABLET BY MOUTH ONE TIME DAILY 90 tablet 3  . loperamide (IMODIUM) 2 MG capsule Take 2 mg by mouth 4 (four) times daily as needed for diarrhea or loose stools.    . metoprolol tartrate (LOPRESSOR) 50 MG tablet TAKE ONE AND ONE-HALF TABLETS BY MOUTH TWICE A DAY. PLEASE CALL OFFICE TO SCHEDULE AN APPOINTMENT FOR FURTHER REFILLS 90 tablet 0  . spironolactone (ALDACTONE) 25 MG tablet TAKE 1 TABLET BY MOUTH DAILY 90 tablet 3   No current facility-administered  medications for this visit.     Past Medical History:  Diagnosis Date  . Cardiomyopathy (HCC)   . Dyskinesia dx'd 2000  . Environmental allergies   . GERD (gastroesophageal reflux disease)   . Hypertension   . IBS (irritable bowel syndrome)   . Nonischemic cardiomyopathy (HCC)    a. EF 15-20% by echo in 2016 and improved to 55-60% by echo in 08/2015, felt to be secondary to HTN  . Pneumonia <2005 X 1; 05/2014  . Recurrent left pleural effusion   . Type II diabetes mellitus (HCC)     Past Surgical History:  Procedure Laterality Date  . LAPAROSCOPIC CHOLECYSTECTOMY  2005  . LUNG DECORTICATION Left 05/2014   . VIDEO ASSISTED THORACOSCOPY (VATS)/DECORTICATION  05/2014   "this time they used the camera"    Social History   Socioeconomic History  . Marital status: Married    Spouse name: Not on file  . Number of children: 3  . Years of education: Not on file  . Highest education level: Not on file  Occupational History  . Not on file  Tobacco Use  . Smoking status: Never Smoker  . Smokeless tobacco: Never Used  Substance and Sexual Activity  . Alcohol use: No  . Drug use: No  . Sexual activity: Not Currently  Other Topics Concern  . Not on file  Social History Narrative  . Not on file   Social Determinants of Health   Financial Resource Strain: Not on file  Food Insecurity: Not on file  Transportation Needs: Not on file  Physical Activity: Not on file  Stress: Not on file  Social Connections: Not on file  Intimate Partner Violence: Not on file    Family History  Problem Relation Age of Onset  . Diabetes Father   . Heart disease Father     ROS: no fevers or chills, productive cough, hemoptysis, dysphasia, odynophagia, melena, hematochezia, dysuria, hematuria, rash, seizure activity, orthopnea, PND, pedal edema, claudication. Remaining systems are negative.  Physical Exam: Well-developed well-nourished in no acute distress.  Skin is warm and dry.  HEENT is  normal.  Neck is supple.  Chest is clear to auscultation with normal expansion.  Cardiovascular exam is regular rate and rhythm.  Abdominal exam nontender or distended. No masses palpated. Extremities show no edema. neuro grossly intact   A/P  1 history of nonischemic cardiomyopathy-felt to be hypertensive mediated.  LV function has improved on most recent echocardiogram.  Continue hydralazine, ACE inhibitor, Toprol and spironolactone.  Patient had recent COVID infection and has mild dyspnea.  We will repeat echocardiogram.  2 hypertension-patient's blood pressure is controlled.  Continue present medications.  Check potassium and renal function.  3 chronic diastolic congestive heart failure-he is euvolemic on examination.  Continue diuretics at present dose.  Note he is not taking Lasix.  I have asked him to only take this as needed as he is not developing CHF symptoms.  Check potassium and renal function.  Continue fluid restriction and low-sodium diet.  Olga Millers, MD

## 2020-06-15 ENCOUNTER — Other Ambulatory Visit: Payer: Self-pay

## 2020-06-15 ENCOUNTER — Ambulatory Visit (INDEPENDENT_AMBULATORY_CARE_PROVIDER_SITE_OTHER): Payer: Self-pay | Admitting: Cardiology

## 2020-06-15 ENCOUNTER — Encounter: Payer: Self-pay | Admitting: Cardiology

## 2020-06-15 VITALS — BP 128/78 | HR 70 | Ht 67.0 in | Wt 185.0 lb

## 2020-06-15 DIAGNOSIS — I428 Other cardiomyopathies: Secondary | ICD-10-CM

## 2020-06-15 DIAGNOSIS — I5032 Chronic diastolic (congestive) heart failure: Secondary | ICD-10-CM

## 2020-06-15 DIAGNOSIS — I10 Essential (primary) hypertension: Secondary | ICD-10-CM

## 2020-06-15 MED ORDER — SPIRONOLACTONE 25 MG PO TABS: ORAL_TABLET | ORAL | 3 refills | Status: DC

## 2020-06-15 MED ORDER — HYDRALAZINE HCL 100 MG PO TABS: 100.0000 mg | ORAL_TABLET | Freq: Three times a day (TID) | ORAL | 3 refills | Status: DC

## 2020-06-15 MED ORDER — LISINOPRIL 40 MG PO TABS: ORAL_TABLET | ORAL | 3 refills | Status: DC

## 2020-06-15 MED ORDER — METOPROLOL TARTRATE 50 MG PO TABS: ORAL_TABLET | ORAL | 3 refills | Status: DC

## 2020-06-15 NOTE — Patient Instructions (Signed)

## 2020-06-16 LAB — BASIC METABOLIC PANEL
BUN/Creatinine Ratio: 10 (ref 9–20)
BUN: 11 mg/dL (ref 6–24)
CO2: 22 mmol/L (ref 20–29)
Calcium: 9.7 mg/dL (ref 8.7–10.2)
Chloride: 97 mmol/L (ref 96–106)
Creatinine, Ser: 1.07 mg/dL (ref 0.76–1.27)
Glucose: 408 mg/dL — ABNORMAL HIGH (ref 65–99)
Potassium: 4 mmol/L (ref 3.5–5.2)
Sodium: 137 mmol/L (ref 134–144)
eGFR: 83 mL/min/{1.73_m2} (ref >59)

## 2020-07-26 ENCOUNTER — Other Ambulatory Visit: Payer: Self-pay | Admitting: *Deleted

## 2020-08-09 ENCOUNTER — Other Ambulatory Visit: Payer: Self-pay

## 2020-08-09 ENCOUNTER — Ambulatory Visit (HOSPITAL_BASED_OUTPATIENT_CLINIC_OR_DEPARTMENT_OTHER)
Admission: RE | Admit: 2020-08-09 | Discharge: 2020-08-09 | Disposition: A | Discharge status: Home / Self Care | Payer: Self-pay | Source: Ambulatory Visit | Attending: Cardiology | Admitting: Cardiology

## 2020-08-09 DIAGNOSIS — I428 Other cardiomyopathies: Secondary | ICD-10-CM | POA: Insufficient documentation

## 2020-08-09 LAB — ECHOCARDIOGRAM COMPLETE
AR max vel: 2.41 cm2
AV Area VTI: 2.61 cm2
AV Area mean vel: 2.47 cm2
AV Mean grad: 3 mmHg
AV Peak grad: 7.3 mmHg
Ao pk vel: 1.35 m/s
Area-P 1/2: 5.13 cm2
Calc EF: 51.6 %
S' Lateral: 2.88 cm
Single Plane A2C EF: 59.8 %
Single Plane A4C EF: 46.6 %

## 2020-08-09 NOTE — Progress Notes (Signed)
*  PRELIMINARY RESULTS* Echocardiogram 2D Echocardiogram has been performed.  Neomia Dear RDCS 08/09/2020, 11:57 AM

## 2021-06-22 ENCOUNTER — Other Ambulatory Visit: Payer: Self-pay | Admitting: *Deleted

## 2021-06-22 ENCOUNTER — Other Ambulatory Visit: Payer: Self-pay

## 2021-06-22 MED ORDER — METOPROLOL TARTRATE 50 MG PO TABS: ORAL_TABLET | ORAL | 3 refills | Status: DC

## 2021-06-22 MED ORDER — LISINOPRIL 40 MG PO TABS: ORAL_TABLET | ORAL | 1 refills | Status: DC

## 2021-06-22 MED ORDER — METOPROLOL TARTRATE 50 MG PO TABS: ORAL_TABLET | ORAL | 1 refills | Status: DC

## 2021-06-22 MED ORDER — LISINOPRIL 40 MG PO TABS: ORAL_TABLET | ORAL | 3 refills | Status: DC

## 2021-08-09 NOTE — Progress Notes (Signed)
HPI:FU cardiomyopathy. Patient was admitted in April 2016 to [facility] in [place] with exudative left pleural effusion secondary to pneumonia requiring VATS. Echocardiogram showed an ejection fraction of 15-20% and moderately reduced right ventricular function. Cardiac MRI showed severely reduced LV function with no evidence of myocardial infarction, myocarditis or infiltrative process. Right ventricular function moderate to severely reduced. Cardiomyopathy felt nonischemic. Renal Doppler September 2016 showed no stenosis. Echocardiogram June 2022 showed normal LV function, grade 1 diastolic dysfunction.  Since last seen, he notes occasional dyspnea on exertion.  Mild orthopnea and mild pedal edema.  No chest pain, palpitations or syncope.  Current Outpatient Medications  Medication Sig Dispense Refill   acetaminophen (TYLENOL) 500 MG tablet Take 500 mg by mouth every 6 (six) hours as needed.     dicyclomine (BENTYL) 10 MG capsule Take 10 mg by mouth 2 (two) times daily.      famotidine (PEPCID) 20 MG tablet Take 20 mg by mouth daily.      hydrALAZINE (APRESOLINE) 100 MG tablet Take 1 tablet (100 mg total) by mouth 3 (three) times daily. 270 tablet 3   ibuprofen (ADVIL) 200 MG tablet Take 200 mg by mouth every 8 (eight) hours as needed for moderate pain.     lisinopril (ZESTRIL) 40 MG tablet TAKE ONE TABLET BY MOUTH ONE TIME DAILY 90 tablet 1   loperamide (IMODIUM) 2 MG capsule Take 2 mg by mouth 4 (four) times daily as needed for diarrhea or loose stools.     metoprolol tartrate (LOPRESSOR) 50 MG tablet TAKE ONE AND ONE-HALF TABLETS BY MOUTH TWICE A DAY. 90 tablet 1   spironolactone (ALDACTONE) 25 MG tablet TAKE 1 TABLET BY MOUTH DAILY 90 tablet 3   No current facility-administered medications for this visit.     Past Medical History:  Diagnosis Date   Cardiomyopathy (HCC)    Dyskinesia dx'd 2000   Environmental allergies    GERD (gastroesophageal reflux disease)     Hypertension    IBS (irritable bowel syndrome)    Nonischemic cardiomyopathy (HCC)    a. EF 15-20% by echo in 2016 and improved to 55-60% by echo in 08/2015, felt to be secondary to HTN   Pneumonia <2005 X 1; 05/2014   Recurrent left pleural effusion    Type II diabetes mellitus (HCC)     Past Surgical History:  Procedure Laterality Date   LAPAROSCOPIC CHOLECYSTECTOMY  2005   LUNG DECORTICATION Left 05/2014    VIDEO ASSISTED THORACOSCOPY (VATS)/DECORTICATION  05/2014   "this time they used the camera"    Social History   Socioeconomic History   Marital status: Married    Spouse name: Not on file   Number of children: 3   Years of education: Not on file   Highest education level: Not on file  Occupational History   Not on file  Tobacco Use   Smoking status: Never   Smokeless tobacco: Never  Substance and Sexual Activity   Alcohol use: No   Drug use: No   Sexual activity: Not Currently  Other Topics Concern   Not on file  Social History Narrative   Not on file   Social Determinants of Health   Financial Resource Strain: Not on file  Food Insecurity: Not on file  Transportation Needs: Not on file  Physical Activity: Not on file  Stress: Not on file  Social Connections: Not on file  Intimate Partner Violence: Not on file    Family History  Problem Relation Age of Onset   Diabetes Father    Heart disease Father     ROS: no fevers or chills, productive cough, hemoptysis, dysphasia, odynophagia, melena, hematochezia, dysuria, hematuria, rash, seizure activity, orthopnea, PND, claudication. Remaining systems are negative.  Physical Exam: Well-developed well-nourished in no acute distress.  Skin is warm and dry.  HEENT is normal.  Neck is supple.  Chest is clear to auscultation with normal expansion.  Cardiovascular exam is regular rate and rhythm.  Abdominal exam nontender or distended. No masses palpated. Extremities show trace to 1+ edema. neuro grossly  intact  ECG-normal sinus rhythm at a rate of 85, left anterior fascicular block.  Personally reviewed  A/P  1 history of nonischemic cardiomyopathy-this was previously felt to be hypertensive mediated.  LV function has normalized on most recent echocardiogram.  Continue hydralazine, ACE inhibitor, Toprol and spironolactone.  He is complaining of mild orthopnea and lower extremity edema.  We will repeat echocardiogram to reassess LV function.  Add Lasix 20 mg daily.  Check potassium and renal function in 1 week.  We will also check BNP.  We will consider addition of Farxiga in the future.  If LV function reduced we will change lisinopril to Entresto.  2 hypertension-blood pressure is elevated.  Discontinue metoprolol and add carvedilol 25 mg twice daily.  We will also add Lasix.  Follow blood pressure and adjust medications as needed.  3 chronic diastolic congestive heart failure-mildly volume overloaded.  Add Lasix 20 mg daily and follow.  Check potassium and renal function in 1 week.  We will consider addition of Farxiga at next office visit.  Olga Millers, MD

## 2021-08-16 ENCOUNTER — Encounter: Payer: Self-pay | Admitting: Cardiology

## 2021-08-16 ENCOUNTER — Ambulatory Visit (INDEPENDENT_AMBULATORY_CARE_PROVIDER_SITE_OTHER): Payer: Self-pay | Admitting: Cardiology

## 2021-08-16 VITALS — BP 142/94 | HR 85 | Ht 67.0 in | Wt 187.1 lb

## 2021-08-16 DIAGNOSIS — I428 Other cardiomyopathies: Secondary | ICD-10-CM

## 2021-08-16 DIAGNOSIS — I5032 Chronic diastolic (congestive) heart failure: Secondary | ICD-10-CM

## 2021-08-16 MED ORDER — LISINOPRIL 40 MG PO TABS: ORAL_TABLET | ORAL | 3 refills | Status: DC

## 2021-08-16 MED ORDER — CARVEDILOL 25 MG PO TABS: 25.0000 mg | ORAL_TABLET | Freq: Two times a day (BID) | ORAL | 3 refills | Status: DC

## 2021-08-16 MED ORDER — SPIRONOLACTONE 25 MG PO TABS: ORAL_TABLET | ORAL | 3 refills | Status: DC

## 2021-08-16 MED ORDER — FUROSEMIDE 20 MG PO TABS: 20.0000 mg | ORAL_TABLET | Freq: Every day | ORAL | 3 refills | Status: DC

## 2021-08-16 MED ORDER — HYDRALAZINE HCL 100 MG PO TABS: 100.0000 mg | ORAL_TABLET | Freq: Three times a day (TID) | ORAL | 3 refills | Status: DC

## 2021-08-16 NOTE — Patient Instructions (Signed)
Medication Instructions:   STOP METOPROLOL  START CARVEDILOL 25 MG TWICE DAILY  START FUROSEMIDE 20 MG ONCE DAILY  *If you need a refill on your cardiac medications before your next appointment, please call your pharmacy*   Lab Work: Your physician recommends that you return for lab work in: ONE WEEK-DO NOT NEED TO FAST  If you have labs (blood work) drawn today and your tests are completely normal, you will receive your results only by: MyChart Message (if you have MyChart) OR A paper copy in the mail If you have any lab test that is abnormal or we need to change your treatment, we will call you to review the results.   Testing/Procedures:  Your physician has requested that you have an echocardiogram. Echocardiography is a painless test that uses sound waves to create images of your heart. It provides your doctor with information about the size and shape of your heart and how well your heart's chambers and valves are working. This procedure takes approximately one hour. There are no restrictions for this procedure. [place] OFF[facility]-1ST FLOOR IMAGING DEPARTMENT   Follow-Up: At [facility], you and your health needs are our priority.  As part of our continuing mission to provide you with exceptional heart care, we have created designated Provider Care Teams.  These Care Teams include your primary Cardiologist (physician) and Advanced Practice Providers (APPs -  Physician Assistants and Nurse Practitioners) who all work together to provide you with the care you need, when you need it.  We recommend signing up for the patient portal called "MyChart".  Sign up information is provided on this After Visit Summary.  MyChart is used to connect with patients for Virtual Visits (Telemedicine).  Patients are able to view lab/test results, encounter notes, upcoming appointments, etc.  Non-urgent messages can be sent to your provider as well.   To learn more about what you can do with MyChart, go  to [email or web address].    Your next appointment:   6-8 week(s)  The format for your next appointment:   In Person  Provider:   Olga Millers, MD{    Important Information About Sugar

## 2021-09-02 LAB — BASIC METABOLIC PANEL
BUN/Creatinine Ratio: 17 (ref 9–20)
BUN: 21 mg/dL (ref 6–24)
CO2: 20 mmol/L (ref 20–29)
Calcium: 9.6 mg/dL (ref 8.7–10.2)
Chloride: 99 mmol/L (ref 96–106)
Creatinine, Ser: 1.26 mg/dL (ref 0.76–1.27)
Glucose: 331 mg/dL — ABNORMAL HIGH (ref 70–99)
Potassium: 4.3 mmol/L (ref 3.5–5.2)
Sodium: 136 mmol/L (ref 134–144)
eGFR: 68 mL/min/{1.73_m2} (ref >59)

## 2021-09-02 LAB — PRO B NATRIURETIC PEPTIDE: NT-Pro BNP: <36 pg/mL (ref 0–121)

## 2021-09-06 ENCOUNTER — Encounter: Payer: Self-pay | Admitting: *Deleted

## 2021-09-07 ENCOUNTER — Ambulatory Visit (HOSPITAL_BASED_OUTPATIENT_CLINIC_OR_DEPARTMENT_OTHER): Admission: RE | Admit: 2021-09-07 | Payer: Self-pay | Source: Ambulatory Visit

## 2021-09-20 ENCOUNTER — Telehealth: Payer: Self-pay | Admitting: Cardiology

## 2021-09-20 NOTE — Telephone Encounter (Signed)
Pt c/o medication issue:  1. Name of Medication: carvedilol (COREG) 25 MG tablet  2. How are you currently taking this medication (dosage and times per day)? As prescribed, but stopped taking yesterday and switched back to the med he was taken before  3. Are you having a reaction (difficulty breathing--STAT)?   4. What is your medication issue? Drowsiness and upset stomach. He states that it may also cause extreme dizziness and drowsiness if he has a bad nights rest. Requesting a call back.

## 2021-09-20 NOTE — Telephone Encounter (Signed)
Spoke with pt, he has been taking carvedilol for about one month and reports the symptoms started shortly after starting the medication. He switched back to metoprolol yesterday. Aware will forward to dr Jens Som to review.

## 2021-09-21 MED ORDER — METOPROLOL TARTRATE 50 MG PO TABS: 75.0000 mg | ORAL_TABLET | Freq: Two times a day (BID) | ORAL | 3 refills | Status: DC

## 2021-09-21 NOTE — Telephone Encounter (Signed)
Spoke with pt, Aware of dr Ludwig Clarks recommendations. Medication added back to his medication list.

## 2021-10-04 NOTE — Progress Notes (Signed)
HPI: FU cardiomyopathy. Patient was admitted in April 2016 to [facility] in [place] with exudative left pleural effusion secondary to pneumonia requiring VATS. Echocardiogram showed an ejection fraction of 15-20% and moderately reduced right ventricular function. Cardiac MRI showed severely reduced LV function with no evidence of myocardial infarction, myocarditis or infiltrative process. Right ventricular function moderate to severely reduced. Cardiomyopathy felt nonischemic. Renal Doppler September 2016 showed no stenosis. Echocardiogram August 2023 showed normal LV function, grade 1 diastolic dysfunction, mild left atrial enlargement.  Since last seen, his dyspnea on exertion has improved.  No orthopnea, PND, pedal edema, chest pain or syncope.  Current Outpatient Medications  Medication Sig Dispense Refill   acetaminophen (TYLENOL) 500 MG tablet Take 500 mg by mouth every 6 (six) hours as needed.     dicyclomine (BENTYL) 10 MG capsule Take 10 mg by mouth 2 (two) times daily.      famotidine (PEPCID) 20 MG tablet Take 20 mg by mouth daily.      furosemide (LASIX) 20 MG tablet Take 1 tablet (20 mg total) by mouth daily. 90 tablet 3   hydrALAZINE (APRESOLINE) 100 MG tablet Take 1 tablet (100 mg total) by mouth 3 (three) times daily. 270 tablet 3   ibuprofen (ADVIL) 200 MG tablet Take 200 mg by mouth every 8 (eight) hours as needed for moderate pain.     lisinopril (ZESTRIL) 40 MG tablet TAKE ONE TABLET BY MOUTH ONE TIME DAILY 90 tablet 3   loperamide (IMODIUM) 2 MG capsule Take 2 mg by mouth 4 (four) times daily as needed for diarrhea or loose stools.     metoprolol tartrate (LOPRESSOR) 50 MG tablet Take 1.5 tablets (75 mg total) by mouth 2 (two) times daily. 180 tablet 3   spironolactone (ALDACTONE) 25 MG tablet TAKE 1 TABLET BY MOUTH DAILY 90 tablet 3   No current facility-administered medications for this visit.     Past Medical History:  Diagnosis Date   Cardiomyopathy  (HCC)    Dyskinesia dx'd 2000   Environmental allergies    GERD (gastroesophageal reflux disease)    Hypertension    IBS (irritable bowel syndrome)    Nonischemic cardiomyopathy (HCC)    a. EF 15-20% by echo in 2016 and improved to 55-60% by echo in 08/2015, felt to be secondary to HTN   Pneumonia <2005 X 1; 05/2014   Recurrent left pleural effusion    Type II diabetes mellitus (HCC)     Past Surgical History:  Procedure Laterality Date   LAPAROSCOPIC CHOLECYSTECTOMY  2005   LUNG DECORTICATION Left 05/2014    VIDEO ASSISTED THORACOSCOPY (VATS)/DECORTICATION  05/2014   "this time they used the camera"    Social History   Socioeconomic History   Marital status: Married    Spouse name: Not on file   Number of children: 3   Years of education: Not on file   Highest education level: Not on file  Occupational History   Not on file  Tobacco Use   Smoking status: Never   Smokeless tobacco: Never  Substance and Sexual Activity   Alcohol use: No   Drug use: No   Sexual activity: Not Currently  Other Topics Concern   Not on file  Social History Narrative   Not on file   Social Determinants of Health   Financial Resource Strain: Not on file  Food Insecurity: Not on file  Transportation Needs: Not on file  Physical Activity: Not on file  Stress: Not on file  Social Connections: Not on file  Intimate Partner Violence: Not on file    Family History  Problem Relation Age of Onset   Diabetes Father    Heart disease Father     ROS: no fevers or chills, productive cough, hemoptysis, dysphasia, odynophagia, melena, hematochezia, dysuria, hematuria, rash, seizure activity, orthopnea, PND, pedal edema, claudication. Remaining systems are negative.  Physical Exam: Well-developed well-nourished in no acute distress.  Skin is warm and dry.  HEENT is normal.  Neck is supple.  Chest is clear to auscultation with normal expansion.  Cardiovascular exam is regular rate and rhythm.   Abdominal exam nontender or distended. No masses palpated. Extremities show no edema. neuro grossly intact   A/P  1 history of nonischemic cardiomyopathy-felt previously to be hypertensive mediated.  LV function improved and remains normal on most recent echocardiogram.  Continue present medications including hydralazine, ACE inhibitor, Toprol and spironolactone.    2 hypertension-blood pressure controlled.  Continue present medical regimen.  3 chronic diastolic congestive heart failure-his status has improved following addition of Lasix at previous office visit.  We will continue.  I considered adding Marcelline Deist; however he does not have health insurance.  He will check and see how much this will cost him and if affordable we will add.  4 diabetes mellitus-patient has not followed by primary care due to insurance.  However I have explained to him the importance of managing his diabetes mellitus and we will arrange a visit with primary care.  Olga Millers, MD

## 2021-10-13 ENCOUNTER — Ambulatory Visit (HOSPITAL_BASED_OUTPATIENT_CLINIC_OR_DEPARTMENT_OTHER)
Admission: RE | Admit: 2021-10-13 | Discharge: 2021-10-13 | Disposition: A | Discharge status: Home / Self Care | Payer: Self-pay | Source: Ambulatory Visit | Attending: Cardiology | Admitting: Cardiology

## 2021-10-13 DIAGNOSIS — I5032 Chronic diastolic (congestive) heart failure: Secondary | ICD-10-CM

## 2021-10-13 DIAGNOSIS — I428 Other cardiomyopathies: Secondary | ICD-10-CM

## 2021-10-13 NOTE — Progress Notes (Signed)
  Echocardiogram 2D Echocardiogram has been performed.  Roosvelt Maser F 10/13/2021, 2:51 PM

## 2021-10-17 LAB — ECHOCARDIOGRAM COMPLETE
AR max vel: 2.47 cm2
AV Area VTI: 3.12 cm2
AV Area mean vel: 2.34 cm2
AV Mean grad: 3 mmHg
AV Peak grad: 4.9 mmHg
Ao pk vel: 1.11 m/s
Area-P 1/2: 3.17 cm2
S' Lateral: 3.2 cm

## 2021-10-18 ENCOUNTER — Ambulatory Visit: Payer: Self-pay | Attending: Cardiology | Admitting: Cardiology

## 2021-10-18 ENCOUNTER — Encounter: Payer: Self-pay | Admitting: Cardiology

## 2021-10-18 VITALS — BP 134/80 | HR 81 | Ht 67.0 in | Wt 184.0 lb

## 2021-10-18 DIAGNOSIS — I5032 Chronic diastolic (congestive) heart failure: Secondary | ICD-10-CM

## 2021-10-18 DIAGNOSIS — I10 Essential (primary) hypertension: Secondary | ICD-10-CM

## 2021-10-18 DIAGNOSIS — I428 Other cardiomyopathies: Secondary | ICD-10-CM

## 2021-10-18 NOTE — Patient Instructions (Signed)
Medication Instructions:   CHECK ON FARXIGA  *If you need a refill on your cardiac medications before your next appointment, please call your pharmacy*   Follow-Up: At [facility], you and your health needs are our priority.  As part of our continuing mission to provide you with exceptional heart care, we have created designated Provider Care Teams.  These Care Teams include your primary Cardiologist (physician) and Advanced Practice Providers (APPs -  Physician Assistants and Nurse Practitioners) who all work together to provide you with the care you need, when you need it.  We recommend signing up for the patient portal called "MyChart".  Sign up information is provided on this After Visit Summary.  MyChart is used to connect with patients for Virtual Visits (Telemedicine).  Patients are able to view lab/test results, encounter notes, upcoming appointments, etc.  Non-urgent messages can be sent to your provider as well.   To learn more about what you can do with MyChart, go to [email or web address].    Your next appointment:   6 month(s)  The format for your next appointment:   In Person  Provider:   Olga Millers, MD    Other Instructions  [facility] AT [phone]  Important Information About Sugar

## 2022-02-24 IMAGING — CR DG CHEST 2V
2 series · 2 of 2 positions shown · non-contrast
Comparison: None.

CLINICAL DATA: Cough

EXAM:
CHEST - 2 VIEW

[w chest pa]
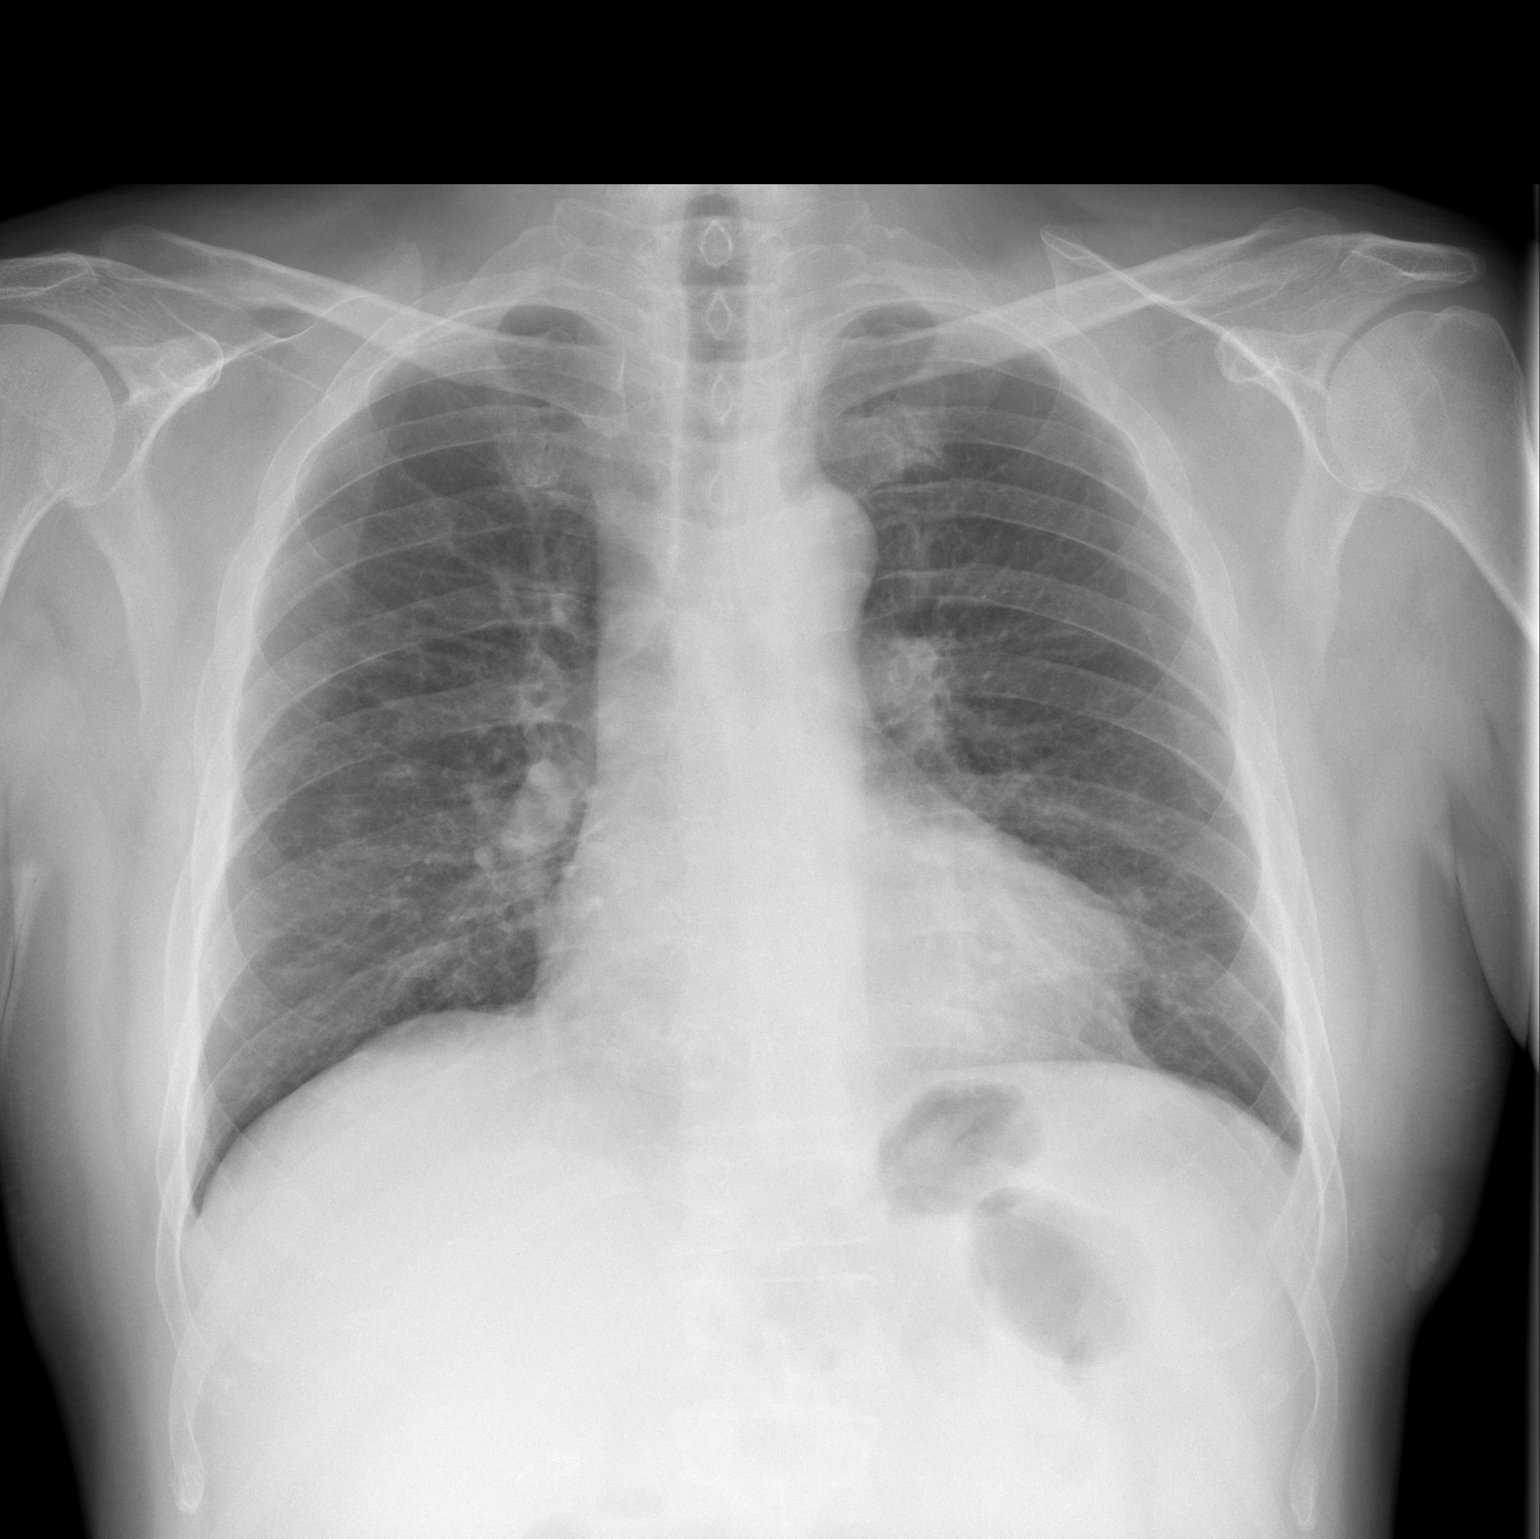

[w chest lat]
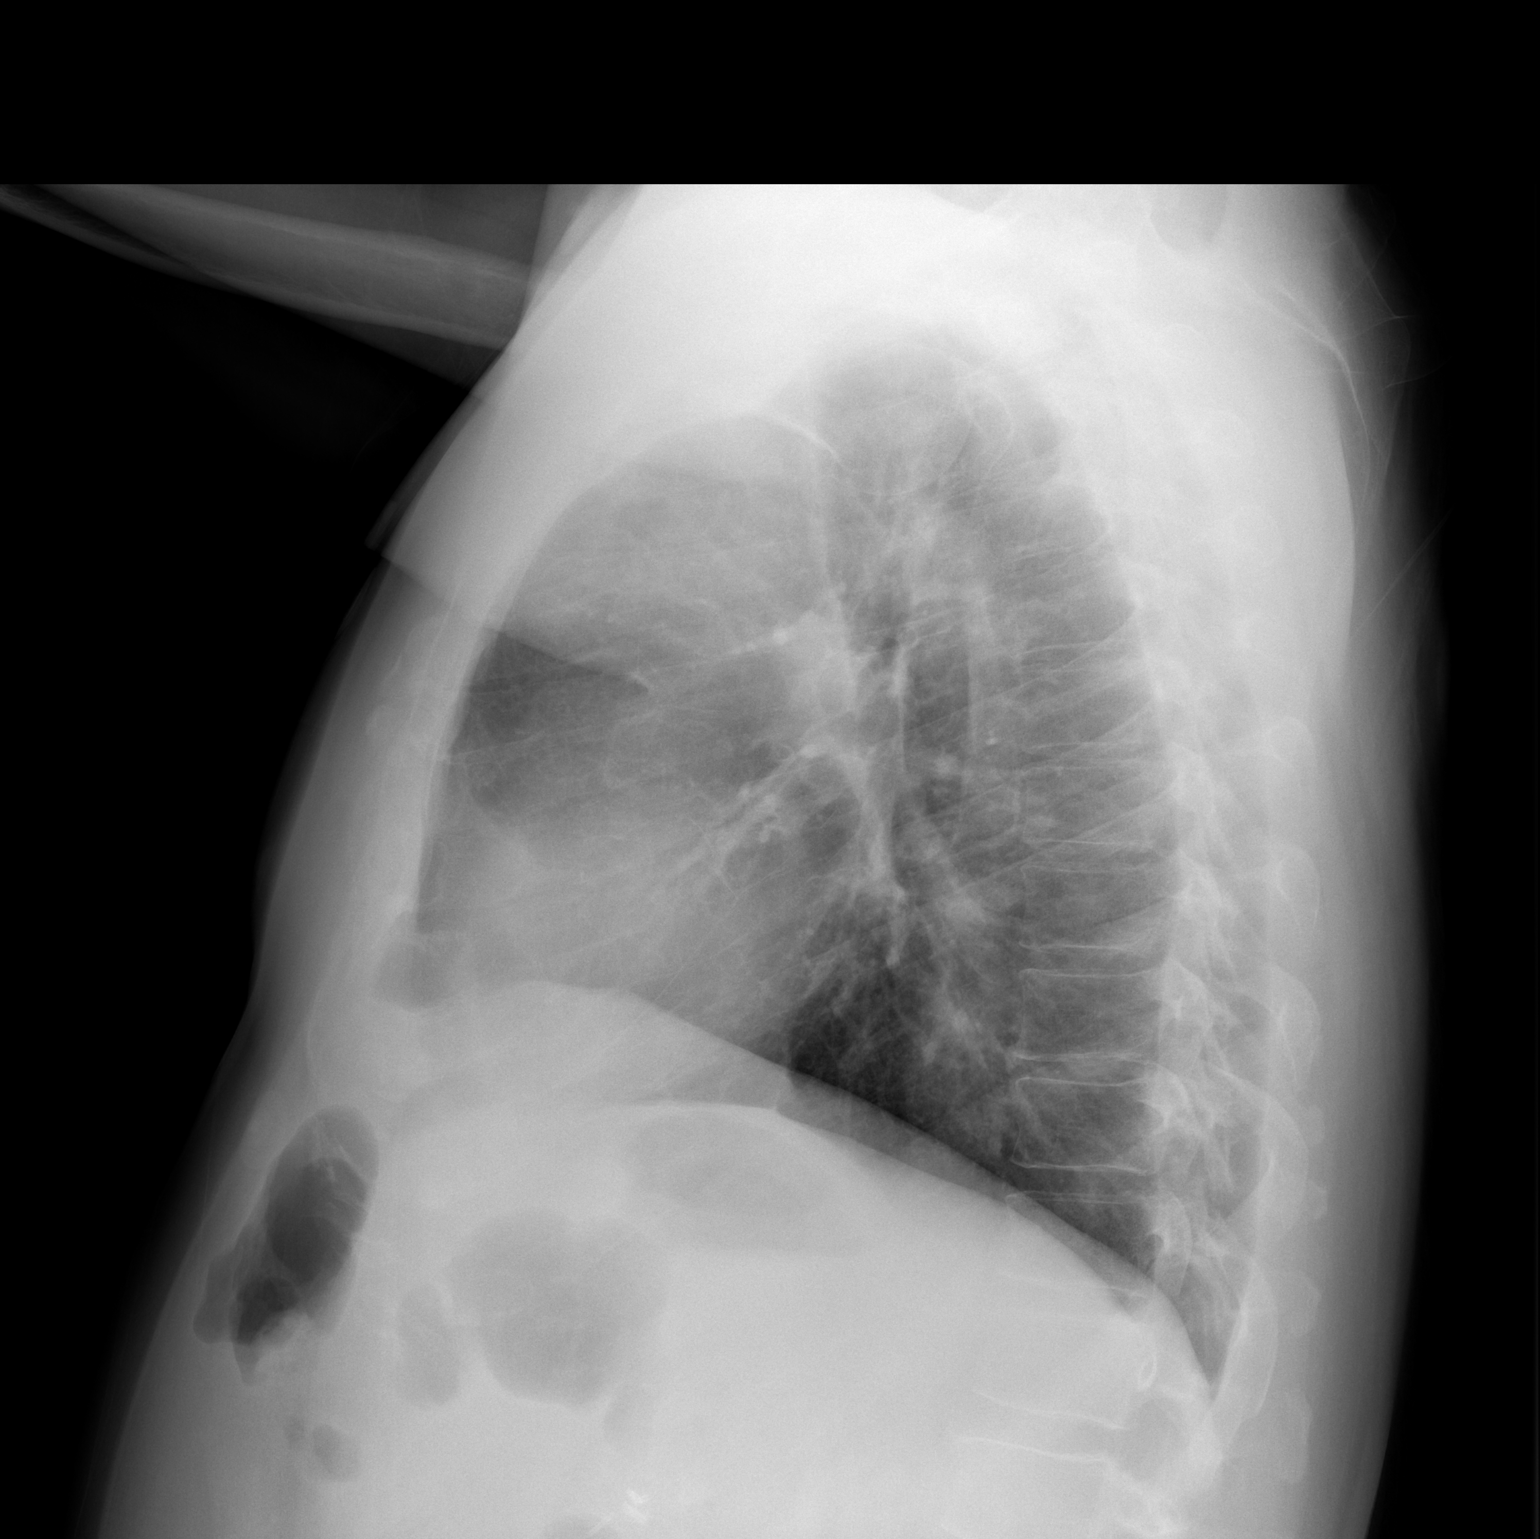

[2 of 2 positions shown; findings below may reference images not displayed]

FINDINGS: The cardiomediastinal silhouette is mildly enlarged in contour. No
pleural effusion. No pneumothorax. Faint bilateral reticulonodular
opacities. Visualized abdomen is unremarkable. No acute osseous
abnormality noted.
IMPRESSION: Faint bilateral reticulonodular opacities, nonspecific. This could
reflect atypical infection or atelectasis.

## 2022-04-11 NOTE — Progress Notes (Deleted)
HPI: FU cardiomyopathy. Patient was admitted in April 2016 to [facility] in [place] with exudative left pleural effusion secondary to pneumonia requiring VATS. Echocardiogram showed an ejection fraction of 15-20% and moderately reduced right ventricular function. Cardiac MRI showed severely reduced LV function with no evidence of myocardial infarction, myocarditis or infiltrative process. Right ventricular function moderate to severely reduced. Cardiomyopathy felt nonischemic. Renal Doppler September 2016 showed no stenosis. Echocardiogram August 2023 showed normal LV function, grade 1 diastolic dysfunction, mild left atrial enlargement.  Since last seen,   Current Outpatient Medications  Medication Sig Dispense Refill   acetaminophen (TYLENOL) 500 MG tablet Take 500 mg by mouth every 6 (six) hours as needed.     dicyclomine (BENTYL) 10 MG capsule Take 10 mg by mouth 2 (two) times daily.      famotidine (PEPCID) 20 MG tablet Take 20 mg by mouth daily.      furosemide (LASIX) 20 MG tablet Take 1 tablet (20 mg total) by mouth daily. 90 tablet 3   hydrALAZINE (APRESOLINE) 100 MG tablet Take 1 tablet (100 mg total) by mouth 3 (three) times daily. 270 tablet 3   ibuprofen (ADVIL) 200 MG tablet Take 200 mg by mouth every 8 (eight) hours as needed for moderate pain.     lisinopril (ZESTRIL) 40 MG tablet TAKE ONE TABLET BY MOUTH ONE TIME DAILY 90 tablet 3   loperamide (IMODIUM) 2 MG capsule Take 2 mg by mouth 4 (four) times daily as needed for diarrhea or loose stools.     metoprolol tartrate (LOPRESSOR) 50 MG tablet Take 1.5 tablets (75 mg total) by mouth 2 (two) times daily. 180 tablet 3   spironolactone (ALDACTONE) 25 MG tablet TAKE 1 TABLET BY MOUTH DAILY 90 tablet 3   No current facility-administered medications for this visit.     Past Medical History:  Diagnosis Date   Cardiomyopathy ([place])    Dyskinesia dx'd 2000   Environmental allergies    GERD (gastroesophageal reflux  disease)    Hypertension    IBS (irritable bowel syndrome)    Nonischemic cardiomyopathy (HCC)    a. EF 15-20% by echo in 2016 and improved to 55-60% by echo in 08/2015, felt to be secondary to HTN   Pneumonia <2005 X 1; 05/2014   Recurrent left pleural effusion    Type II diabetes mellitus ([place])     Past Surgical History:  Procedure Laterality Date   LAPAROSCOPIC CHOLECYSTECTOMY  2005   LUNG DECORTICATION Left 05/2014    VIDEO ASSISTED THORACOSCOPY (VATS)/DECORTICATION  05/2014   "this time they used the camera"    Social History   Socioeconomic History   Marital status: Married    Spouse name: Not on file   Number of children: 3   Years of education: Not on file   Highest education level: Not on file  Occupational History   Not on file  Tobacco Use   Smoking status: Never   Smokeless tobacco: Never  Substance and Sexual Activity   Alcohol use: No   Drug use: No   Sexual activity: Not Currently  Other Topics Concern   Not on file  Social History Narrative   Not on file   Social Determinants of Health   Financial Resource Strain: Not on file  Food Insecurity: Not on file  Transportation Needs: Not on file  Physical Activity: Not on file  Stress: Not on file  Social Connections: Not on file  Intimate Partner Violence: Not on  file    Family History  Problem Relation Age of Onset   Diabetes Father    Heart disease Father     ROS: no fevers or chills, productive cough, hemoptysis, dysphasia, odynophagia, melena, hematochezia, dysuria, hematuria, rash, seizure activity, orthopnea, PND, pedal edema, claudication. Remaining systems are negative.  Physical Exam: Well-developed well-nourished in no acute distress.  Skin is warm and dry.  HEENT is normal.  Neck is supple.  Chest is clear to auscultation with normal expansion.  Cardiovascular exam is regular rate and rhythm.  Abdominal exam nontender or distended. No masses palpated. Extremities show no  edema. neuro grossly intact  ECG- personally reviewed  A/P  1 nonischemic cardiomyopathy-this was felt previously to be related to uncontrolled hypertension.  LV function has normalized on most recent echocardiogram.  Continue present medications including hydralazine, ACE inhibitor, Toprol and spironolactone.  2 hypertension-patient's blood pressure is now controlled.  Continue present medications and follow-up.  3 chronic diastolic congestive heart failure-continue Lasix at present dose.  Did not add SGLT2 inhibitor previously due to expense.  Check potassium and renal function.  4 diabetes mellitus-Per primary care.  Kirk Ruths, MD

## 2022-04-25 ENCOUNTER — Ambulatory Visit: Payer: Self-pay | Admitting: Cardiology

## 2022-06-28 ENCOUNTER — Telehealth: Payer: Self-pay | Admitting: Cardiology

## 2022-06-28 ENCOUNTER — Other Ambulatory Visit: Payer: Self-pay

## 2022-06-28 MED ORDER — METOPROLOL TARTRATE 50 MG PO TABS: 75.0000 mg | ORAL_TABLET | Freq: Two times a day (BID) | ORAL | 3 refills | Status: DC

## 2022-06-28 NOTE — Telephone Encounter (Signed)
*  STAT* If patient is at the pharmacy, call can be transferred to refill team.   1. Which medications need to be refilled? (please list name of each medication and dose if known)   metoprolol tartrate (LOPRESSOR) 50 MG tablet    2. Which pharmacy/location (including street and city if local pharmacy) is medication to be sent to?  [facility] [address] [place], [place] - [address] AT [facility] [address] Phone: [phone]  Fax: [phone]      3. Do they need a 30 day or 90 day supply? 90

## 2022-06-29 MED ORDER — METOPROLOL TARTRATE 50 MG PO TABS: 75.0000 mg | ORAL_TABLET | Freq: Two times a day (BID) | ORAL | 0 refills | Status: DC

## 2022-06-29 NOTE — Telephone Encounter (Signed)
Refills has been sent to the pharmacy. 

## 2022-08-14 ENCOUNTER — Other Ambulatory Visit: Payer: Self-pay | Admitting: Cardiology

## 2022-08-20 NOTE — Progress Notes (Signed)
HPI: FU cardiomyopathy. Patient was admitted in April 2016 to [facility] in [place] with exudative left pleural effusion secondary to pneumonia requiring VATS. Echocardiogram showed an ejection fraction of 15-20% and moderately reduced right ventricular function. Cardiac MRI showed severely reduced LV function with no evidence of myocardial infarction, myocarditis or infiltrative process. Right ventricular function moderate to severely reduced. Cardiomyopathy felt nonischemic. Renal Doppler September 2016 showed no stenosis. Echocardiogram August 2023 showed normal LV function, grade 1 diastolic dysfunction, mild left atrial enlargement.  Since last seen, the patient denies any dyspnea on exertion, orthopnea, PND, pedal edema, palpitations, syncope or chest pain.   Current Outpatient Medications  Medication Sig Dispense Refill   acetaminophen (TYLENOL) 500 MG tablet Take 500 mg by mouth every 6 (six) hours as needed.     dicyclomine (BENTYL) 10 MG capsule Take 10 mg by mouth 2 (two) times daily.      famotidine (PEPCID) 20 MG tablet Take 20 mg by mouth daily.      furosemide (LASIX) 20 MG tablet Take 1 tablet (20 mg total) by mouth daily. 90 tablet 3   hydrALAZINE (APRESOLINE) 100 MG tablet Take 1 tablet (100 mg total) by mouth 3 (three) times daily. 270 tablet 3   ibuprofen (ADVIL) 200 MG tablet Take 200 mg by mouth every 8 (eight) hours as needed for moderate pain.     lisinopril (ZESTRIL) 40 MG tablet TAKE ONE TABLET BY MOUTH ONE TIME DAILY 90 tablet 0   loperamide (IMODIUM) 2 MG capsule Take 2 mg by mouth 4 (four) times daily as needed for diarrhea or loose stools.     metoprolol tartrate (LOPRESSOR) 50 MG tablet Take 1.5 tablets (75 mg total) by mouth 2 (two) times daily. 270 tablet 0   spironolactone (ALDACTONE) 25 MG tablet TAKE 1 TABLET BY MOUTH DAILY 90 tablet 3   No current facility-administered medications for this visit.     Past Medical History:  Diagnosis Date    Cardiomyopathy (HCC)    Dyskinesia dx'd 2000   Environmental allergies    GERD (gastroesophageal reflux disease)    Hypertension    IBS (irritable bowel syndrome)    Nonischemic cardiomyopathy (HCC)    a. EF 15-20% by echo in 2016 and improved to 55-60% by echo in 08/2015, felt to be secondary to HTN   Pneumonia <2005 X 1; 05/2014   Recurrent left pleural effusion    Type II diabetes mellitus (HCC)     Past Surgical History:  Procedure Laterality Date   LAPAROSCOPIC CHOLECYSTECTOMY  2005   LUNG DECORTICATION Left 05/2014    VIDEO ASSISTED THORACOSCOPY (VATS)/DECORTICATION  05/2014   "this time they used the camera"    Social History   Socioeconomic History   Marital status: Married    Spouse name: Not on file   Number of children: 3   Years of education: Not on file   Highest education level: Not on file  Occupational History   Not on file  Tobacco Use   Smoking status: Never   Smokeless tobacco: Never  Substance and Sexual Activity   Alcohol use: No   Drug use: No   Sexual activity: Not Currently  Other Topics Concern   Not on file  Social History Narrative   Not on file   Social Determinants of Health   Financial Resource Strain: Not on file  Food Insecurity: Not on file  Transportation Needs: Not on file  Physical Activity: Not on file  Stress: Not on file  Social Connections: Not on file  Intimate Partner Violence: Not on file    Family History  Problem Relation Age of Onset   Diabetes Father    Heart disease Father     ROS: no fevers or chills, productive cough, hemoptysis, dysphasia, odynophagia, melena, hematochezia, dysuria, hematuria, rash, seizure activity, orthopnea, PND, pedal edema, claudication. Remaining systems are negative.  Physical Exam: Well-developed well-nourished in no acute distress.  Skin is warm and dry.  HEENT is normal.  Neck is supple.  Chest is clear to auscultation with normal expansion.  Cardiovascular exam is regular  rate and rhythm.  Abdominal exam nontender or distended. No masses palpated. Extremities show no edema. neuro grossly intact  EKG Interpretation Date/Time:  Wednesday August 29 2022 11:38:22 EDT Ventricular Rate:  77 PR Interval:  178 QRS Duration:  96 QT Interval:  396 QTC Calculation: 448 R Axis:   -61  Text Interpretation: Normal sinus rhythm Left axis deviation When compared with ECG of 26-Mar-2020 19:28, No significant change was found Confirmed by Olga Millers ([id number]) on 08/29/2022 11:41:00 AM    A/P  1 history of nonischemic cardiomyopathy-this was previously felt to be hypertensive mediated.  Echocardiogram most recently shows preserved LV function.  Continue present medications including ACE inhibitor, Toprol, hydralazine and spironolactone.  2 hypertension-patient's blood pressure is controlled.  Continue present medical regimen and follow.  3 chronic diastolic congestive heart failure-volume status is reasonable at present.  Continue Lasix at present dose.  SGLT2 inhibitor not added due to expense.  Potassium and renal function will be monitored by primary care.  4 diabetes mellitus-follow-up primary care.  Olga Millers, MD

## 2022-08-22 ENCOUNTER — Other Ambulatory Visit: Payer: Self-pay | Admitting: *Deleted

## 2022-08-22 MED ORDER — HYDRALAZINE HCL 100 MG PO TABS: 100.0000 mg | ORAL_TABLET | Freq: Three times a day (TID) | ORAL | 3 refills | Status: DC

## 2022-08-29 ENCOUNTER — Ambulatory Visit: Payer: Self-pay | Attending: Cardiology | Admitting: Cardiology

## 2022-08-29 ENCOUNTER — Encounter: Payer: Self-pay | Admitting: Cardiology

## 2022-08-29 VITALS — BP 106/73 | HR 77 | Ht 67.0 in | Wt 185.8 lb

## 2022-08-29 DIAGNOSIS — I5032 Chronic diastolic (congestive) heart failure: Secondary | ICD-10-CM

## 2022-08-29 DIAGNOSIS — I428 Other cardiomyopathies: Secondary | ICD-10-CM

## 2022-08-29 DIAGNOSIS — I10 Essential (primary) hypertension: Secondary | ICD-10-CM

## 2022-08-29 NOTE — Patient Instructions (Signed)
    Follow-Up: At [facility], you and your health needs are our priority.  As part of our continuing mission to provide you with exceptional heart care, we have created designated Provider Care Teams.  These Care Teams include your primary Cardiologist (physician) and Advanced Practice Providers (APPs -  Physician Assistants and Nurse Practitioners) who all work together to provide you with the care you need, when you need it.  We recommend signing up for the patient portal called "MyChart".  Sign up information is provided on this After Visit Summary.  MyChart is used to connect with patients for Virtual Visits (Telemedicine).  Patients are able to view lab/test results, encounter notes, upcoming appointments, etc.  Non-urgent messages can be sent to your provider as well.   To learn more about what you can do with MyChart, go to [email or web address].    Your next appointment:   12 month(s)  Provider:   Brian Crenshaw, MD     

## 2022-09-05 ENCOUNTER — Other Ambulatory Visit: Payer: Self-pay

## 2022-09-05 MED ORDER — METOPROLOL TARTRATE 50 MG PO TABS: 75.0000 mg | ORAL_TABLET | Freq: Two times a day (BID) | ORAL | 3 refills | Status: DC

## 2022-11-14 ENCOUNTER — Other Ambulatory Visit (HOSPITAL_BASED_OUTPATIENT_CLINIC_OR_DEPARTMENT_OTHER): Payer: Self-pay

## 2022-11-14 MED ORDER — SPIRONOLACTONE 25 MG PO TABS: ORAL_TABLET | ORAL | 3 refills | Status: DC

## 2022-11-14 MED ORDER — FUROSEMIDE 20 MG PO TABS: 20.0000 mg | ORAL_TABLET | Freq: Every day | ORAL | 3 refills | Status: DC

## 2022-11-14 NOTE — Addendum Note (Signed)
Addended by: Anselm Lis A on: 11/14/2022 07:49 AM   Modules accepted: Orders

## 2022-12-11 ENCOUNTER — Other Ambulatory Visit: Payer: Self-pay | Admitting: Cardiology

## 2023-09-11 ENCOUNTER — Telehealth: Payer: Self-pay | Admitting: Cardiology

## 2023-09-11 MED ORDER — HYDRALAZINE HCL 100 MG PO TABS: 100.0000 mg | ORAL_TABLET | Freq: Three times a day (TID) | ORAL | 0 refills | Status: DC

## 2023-09-11 MED ORDER — METOPROLOL TARTRATE 50 MG PO TABS: 75.0000 mg | ORAL_TABLET | Freq: Two times a day (BID) | ORAL | 0 refills | Status: DC

## 2023-09-11 NOTE — Telephone Encounter (Signed)
 RX sent to requested Pharmacy

## 2023-09-11 NOTE — Telephone Encounter (Signed)
*  STAT* If patient is at the pharmacy, call can be transferred to refill team.   1. Which medications need to be refilled? (please list name of each medication and dose if known)   metoprolol  tartrate (LOPRESSOR ) 50 MG tablet  hydrALAZINE  (APRESOLINE ) 100 MG tablet   2. Would you like to learn more about the convenience, safety, & potential cost savings by using the [facility]?   3. Are you open to using the [facility] (Type [facility]. ).   4. Which pharmacy/location (including street and city if local pharmacy) is medication to be sent to?  [facility] [address], [place] - [address] AT [facility] [address]   5. Do they need a 30 day or 90 day supply?  90 day  Patient stated he still has some medication left.  Patient has appointment scheduled on 10/16 with Dr. Pietro.

## 2023-10-03 LAB — COLOGUARD: COLOGUARD: NEGATIVE

## 2023-11-21 NOTE — Progress Notes (Signed)
 HPI: FU cardiomyopathy. Patient was admitted in April 2016 to [facility] in [place] with exudative left pleural effusion secondary to pneumonia requiring VATS. Echocardiogram showed an ejection fraction of 15-20% and moderately reduced right ventricular function. Cardiac MRI showed severely reduced LV function with no evidence of myocardial infarction, myocarditis or infiltrative process. Right ventricular function moderate to severely reduced. Cardiomyopathy felt nonischemic. Renal Doppler September 2016 showed no stenosis. Echocardiogram August 2023 showed normal LV function, grade 1 diastolic dysfunction, mild left atrial enlargement. Since last seen, patient has dyspnea with more vigorous activities but not routine activities.  No orthopnea, PND, pedal edema, chest pain or syncope.  Current Outpatient Medications  Medication Sig Dispense Refill   acetaminophen  (TYLENOL ) 500 MG tablet Take 500 mg by mouth every 6 (six) hours as needed.     chlorpheniramine (CHLOR-TRIMETON) 4 MG tablet Take 4 mg by mouth.     dicyclomine  (BENTYL ) 10 MG capsule Take 10 mg by mouth 2 (two) times daily.      famotidine  (PEPCID ) 20 MG tablet Take 20 mg by mouth daily.      furosemide  (LASIX ) 20 MG tablet Take 1 tablet (20 mg total) by mouth daily. 90 tablet 3   glipiZIDE  (GLUCOTROL ) 5 MG tablet Take 5 mg by mouth.     hydrALAZINE  (APRESOLINE ) 100 MG tablet Take 1 tablet (100 mg total) by mouth 3 (three) times daily. 270 tablet 0   ibuprofen (ADVIL) 200 MG tablet Take 200 mg by mouth every 8 (eight) hours as needed for moderate pain.     lisinopril  (ZESTRIL ) 40 MG tablet TAKE ONE TABLET BY MOUTH ONE TIME DAILY 90 tablet 3   loperamide (IMODIUM) 2 MG capsule Take 2 mg by mouth 4 (four) times daily as needed for diarrhea or loose stools.     metFORMIN (GLUCOPHAGE-XR) 500 MG 24 hr tablet Take 500 mg by mouth 2 (two) times daily.     metoprolol  tartrate (LOPRESSOR ) 50 MG tablet Take 1.5 tablets (75 mg total)  by mouth 2 (two) times daily. 270 tablet 0   pregabalin (LYRICA) 25 MG capsule Take 25 mg by mouth.     spironolactone  (ALDACTONE ) 25 MG tablet TAKE 1 TABLET BY MOUTH DAILY 90 tablet 3   No current facility-administered medications for this visit.     Past Medical History:  Diagnosis Date   Cardiomyopathy (HCC)    Dyskinesia dx'd 2000   Environmental allergies    GERD (gastroesophageal reflux disease)    Hypertension    IBS (irritable bowel syndrome)    Nonischemic cardiomyopathy (HCC)    a. EF 15-20% by echo in 2016 and improved to 55-60% by echo in 08/2015, felt to be secondary to HTN   Pneumonia <2005 X 1; 05/2014   Recurrent left pleural effusion    Type II diabetes mellitus (HCC)     Past Surgical History:  Procedure Laterality Date   LAPAROSCOPIC CHOLECYSTECTOMY  2005   LUNG DECORTICATION Left 05/2014    VIDEO ASSISTED THORACOSCOPY (VATS)/DECORTICATION  05/2014   this time they used the camera    Social History   Socioeconomic History   Marital status: Married    Spouse name: Not on file   Number of children: 3   Years of education: Not on file   Highest education level: Not on file  Occupational History   Not on file  Tobacco Use   Smoking status: Never   Smokeless tobacco: Never  Substance and Sexual Activity  Alcohol use: No   Drug use: No   Sexual activity: Not Currently  Other Topics Concern   Not on file  Social History Narrative   Not on file   Social Drivers of Health   Financial Resource Strain: Low Risk  (08/15/2023)   Received from [facility]   Overall Financial Resource Strain (CARDIA)    Difficulty of Paying Living Expenses: Not hard at all  Food Insecurity: No Food Insecurity (08/15/2023)   Received from [facility]   Hunger Vital Sign    Within the past 12 months, you worried that your food would run out before you got the money to buy more.: Never true    Within the past 12 months, the food you bought just didn't last and you  didn't have money to get more.: Never true  Transportation Needs: No Transportation Needs (08/15/2023)   Received from [facility] - Transportation    Lack of Transportation (Medical): No    Lack of Transportation (Non-Medical): No  Physical Activity: Insufficiently Active (12/10/2022)   Received from [facility]   Exercise Vital Sign    On average, how many days per week do you engage in moderate to strenuous exercise (like a brisk walk)?: 2 days    On average, how many minutes do you engage in exercise at this level?: 10 min  Stress: No Stress Concern Present (12/10/2022)   Received from [facility] - Occupational Stress Questionnaire    Feeling of Stress : Not at all  Social Connections: Socially Integrated (12/10/2022)   Received from [facility]   Social Network    How would you rate your social network (family, work, friends)?: Good participation with social networks  Intimate Partner Violence: Unknown (07/18/2021)   Received from [facility]   HITS    Physically Hurt: Not on file    Insult or Talk Down To: Not on file    Threaten Physical Harm: Not on file    Scream or Curse: Not on file    Family History  Problem Relation Age of Onset   Diabetes Father    Heart disease Father     ROS: no fevers or chills, productive cough, hemoptysis, dysphasia, odynophagia, melena, hematochezia, dysuria, hematuria, rash, seizure activity, orthopnea, PND, pedal edema, claudication. Remaining systems are negative.  Physical Exam: Well-developed well-nourished in no acute distress.  Skin is warm and dry.  HEENT is normal.  Neck is supple.  Chest is clear to auscultation with normal expansion.  Cardiovascular exam is regular rate and rhythm.  Abdominal exam nontender or distended. No masses palpated. Extremities show no edema. neuro grossly intact  EKG Interpretation Date/Time:  Thursday December 05 2023 10:47:16  EDT Ventricular Rate:  64 PR Interval:  196 QRS Duration:  108 QT Interval:  414 QTC Calculation: 427 R Axis:   -48  Text Interpretation: Normal sinus rhythm Left axis deviation Confirmed by Pietro Rogue ([id number]) on 12/05/2023 10:51:48 AM    A/P  1 history of nonischemic cardiomyopathy-previously felt to be hypertensive mediated.  Most recent echocardiogram shows improved LV function.  Continue present medications including Toprol , spironolactone , ACE inhibitor and hydralazine .  Repeat echocardiogram.  2 hypertension-blood pressure controlled.  Continue present medical regimen.  3 chronic diastolic congestive heart failure-patient appears to be euvolemic on examination.  Continue diuretic at present dose.  SGLT2 inhibitor not added due to expense.  Check potassium and renal function.  4 diabetes mellitus-given  history of diabetes mellitus will add Crestor 20 mg daily.  Check lipids and liver in 8 weeks.  Redell Shallow, MD

## 2023-12-05 ENCOUNTER — Ambulatory Visit: Payer: Self-pay | Attending: Cardiology | Admitting: Cardiology

## 2023-12-05 ENCOUNTER — Encounter: Payer: Self-pay | Admitting: Cardiology

## 2023-12-05 VITALS — BP 134/84 | HR 64 | Ht 67.0 in | Wt 192.0 lb

## 2023-12-05 DIAGNOSIS — I428 Other cardiomyopathies: Secondary | ICD-10-CM | POA: Diagnosis not present

## 2023-12-05 DIAGNOSIS — I10 Essential (primary) hypertension: Secondary | ICD-10-CM | POA: Insufficient documentation

## 2023-12-05 DIAGNOSIS — E785 Hyperlipidemia, unspecified: Secondary | ICD-10-CM | POA: Insufficient documentation

## 2023-12-05 DIAGNOSIS — I5032 Chronic diastolic (congestive) heart failure: Secondary | ICD-10-CM | POA: Insufficient documentation

## 2023-12-05 MED ORDER — ROSUVASTATIN CALCIUM 20 MG PO TABS: 20.0000 mg | ORAL_TABLET | Freq: Every day | ORAL | 3 refills | Status: AC

## 2023-12-05 NOTE — Patient Instructions (Signed)
 Medication Instructions:   START ROSUVASTATIN 20 MG ONCE DAILY  *If you need a refill on your cardiac medications before your next appointment, please call your pharmacy*  Lab Work:  Your physician recommends that you return for lab work in: 8 WEEKS -FASTING  If you have labs (blood work) drawn today and your tests are completely normal, you will receive your results only by: MyChart Message (if you have MyChart) OR A paper copy in the mail If you have any lab test that is abnormal or we need to change your treatment, we will call you to review the results.  Testing/Procedures:  Your physician has requested that you have an echocardiogram. Echocardiography is a painless test that uses sound waves to create images of your heart. It provides your doctor with information about the size and shape of your heart and how well your heart's chambers and valves are working. This procedure takes approximately one hour. There are no restrictions for this procedure. Please do NOT wear cologne, perfume, aftershave, or lotions (deodorant is allowed). Please arrive 15 minutes prior to your appointment time.  Please note: We ask at that you not bring children with you during ultrasound (echo/ vascular) testing. Due to room size and safety concerns, children are not allowed in the ultrasound rooms during exams. Our front office staff cannot provide observation of children in our lobby area while testing is being conducted. An adult accompanying a patient to their appointment will only be allowed in the ultrasound room at the discretion of the ultrasound technician under special circumstances. We apologize for any inconvenience. [facility] [facility]-SCH[facility]ULE IN 8 WEEKS  Follow-Up: At [facility], you and your health needs are our priority.  As part of our continuing mission to provide you with exceptional heart care, our providers are all part of one team.  This team includes your primary  Cardiologist (physician) and Advanced Practice Providers or APPs (Physician Assistants and Nurse Practitioners) who all work together to provide you with the care you need, when you need it.  Your next appointment:   12 month(s)  Provider:   Redell Shallow, MD IN [place]

## 2023-12-05 NOTE — Addendum Note (Signed)
 Addended by: RICHIE ADRIEN ORN on: 12/05/2023 11:10 AM   Modules accepted: Orders

## 2023-12-06 ENCOUNTER — Other Ambulatory Visit: Payer: Self-pay | Admitting: Cardiology

## 2023-12-25 ENCOUNTER — Other Ambulatory Visit: Payer: Self-pay | Admitting: Cardiology

## 2023-12-26 ENCOUNTER — Telehealth: Payer: Self-pay | Admitting: Cardiology

## 2023-12-26 MED ORDER — METOPROLOL TARTRATE 50 MG PO TABS: 75.0000 mg | ORAL_TABLET | Freq: Two times a day (BID) | ORAL | 3 refills | Status: AC

## 2023-12-26 MED ORDER — SPIRONOLACTONE 25 MG PO TABS: ORAL_TABLET | ORAL | 3 refills | Status: AC

## 2023-12-26 MED ORDER — HYDRALAZINE HCL 100 MG PO TABS: 100.0000 mg | ORAL_TABLET | Freq: Three times a day (TID) | ORAL | 3 refills | Status: AC

## 2023-12-26 MED ORDER — FUROSEMIDE 20 MG PO TABS
20.0000 mg | ORAL_TABLET | Freq: Every day | ORAL | 3 refills | Status: AC
Start: 2023-12-26 — End: ?

## 2023-12-26 NOTE — Telephone Encounter (Signed)
 RX sent in

## 2023-12-26 NOTE — Telephone Encounter (Signed)
*  STAT* If patient is at the pharmacy, call can be transferred to refill team.   1. Which medications need to be refilled? (please list name of each medication and dose if known)   metoprolol  tartrate (LOPRESSOR ) 50 MG tablet  hydrALAZINE  (APRESOLINE ) 100 MG tablet  spironolactone  (ALDACTONE ) 25 MG tablet  furosemide  (LASIX ) 20 MG tablet   2. Would you like to learn more about the convenience, safety, & potential cost savings by using the [facility]?   3. Are you open to using the [facility] (Type [facility]. ).  4. Which pharmacy/location (including street and city if local pharmacy) is medication to be sent to?  [facility] [address] [place], [place] - [address] AT [facility] [address]   5. Do they need a 30 day or 90 day supply?  90 day  Patient stated he is completely out of these medications.  Patient wants a call back to confirm prescriptions sent.

## 2024-01-28 ENCOUNTER — Ambulatory Visit: Payer: Self-pay | Admitting: Cardiology

## 2024-01-28 ENCOUNTER — Ambulatory Visit (HOSPITAL_BASED_OUTPATIENT_CLINIC_OR_DEPARTMENT_OTHER): Admission: RE | Admit: 2024-01-28 | Discharge: 2024-01-28 | Attending: Cardiology | Admitting: Cardiology

## 2024-01-28 DIAGNOSIS — I428 Other cardiomyopathies: Secondary | ICD-10-CM

## 2024-01-28 LAB — ECHOCARDIOGRAM COMPLETE
AR max vel: 1.89 cm2
AV Area VTI: 1.96 cm2
AV Area mean vel: 1.85 cm2
AV Mean grad: 4 mmHg
AV Peak grad: 6.3 mmHg
Ao pk vel: 1.26 m/s
Area-P 1/2: 5.23 cm2
Calc EF: 46.2 %
MV M vel: 3.82 m/s
MV Peak grad: 58.4 mmHg
S' Lateral: 5 cm
Single Plane A2C EF: 45.7 %
Single Plane A4C EF: 47.5 %
